# Patient Record
Sex: Female | Born: 1985 | Race: Black or African American | Hispanic: No | Marital: Married | Smoking: Never smoker
Health system: Southern US, Community
[De-identification: ages and names within clinical notes are randomized; demographics above are authoritative.]

## PROBLEM LIST (undated history)

## (undated) DIAGNOSIS — K219 Gastro-esophageal reflux disease without esophagitis: Secondary | ICD-10-CM

## (undated) DIAGNOSIS — Z789 Other specified health status: Secondary | ICD-10-CM

## (undated) HISTORY — PX: NO PAST SURGERIES: SHX2092

---

## 2016-10-28 LAB — OB RESULTS CONSOLE HIV ANTIBODY (ROUTINE TESTING): HIV: NONREACTIVE

## 2016-10-28 LAB — OB RESULTS CONSOLE ANTIBODY SCREEN: ANTIBODY SCREEN: NEGATIVE

## 2016-10-28 LAB — OB RESULTS CONSOLE HEPATITIS B SURFACE ANTIGEN: Hepatitis B Surface Ag: NEGATIVE

## 2016-10-28 LAB — OB RESULTS CONSOLE GC/CHLAMYDIA
Chlamydia: NEGATIVE
GC PROBE AMP, GENITAL: NEGATIVE

## 2016-10-28 LAB — OB RESULTS CONSOLE ABO/RH: RH TYPE: POSITIVE

## 2016-10-28 LAB — OB RESULTS CONSOLE RUBELLA ANTIBODY, IGM: RUBELLA: IMMUNE

## 2016-10-28 LAB — OB RESULTS CONSOLE RPR: RPR: NONREACTIVE

## 2017-01-14 NOTE — L&D Delivery Note (Signed)
Delivery Note Patient pushed for 3 hours 45 minutes.  She was offered a VAVD to assist delivery as she became fatigued, but she declined.  At 6:41 PM a viable female was delivered via Vaginal, Spontaneous (Presentation:LOA, with right hand compound presentation).  APGAR: 8, 9; weight pending .   Placenta status: spontaneous, in tact .  Cord: 3V with the following complications: None.  Cord pH: n/a  Anesthesia:  Epidural Episiotomy: None Lacerations: left vaginal sidewall , bilateral labial Suture Repair: 2.0 3.0 vicryl rapide Est. Blood Loss (mL): 300  Mom to postpartum.  Baby to Couplet care / Skin to Skin.  Arlee Santosuosso GEFFEL Toris Laverdiere 06/01/2017, 7:14 PM

## 2017-03-28 ENCOUNTER — Other Ambulatory Visit: Payer: Self-pay | Admitting: Obstetrics and Gynecology

## 2017-03-28 DIAGNOSIS — R1011 Right upper quadrant pain: Secondary | ICD-10-CM

## 2017-04-25 ENCOUNTER — Ambulatory Visit
Admission: RE | Admit: 2017-04-25 | Discharge: 2017-04-25 | Disposition: A | Discharge status: Home / Self Care | Payer: Self-pay | Source: Ambulatory Visit | Attending: Obstetrics and Gynecology | Admitting: Obstetrics and Gynecology

## 2017-04-25 DIAGNOSIS — R1011 Right upper quadrant pain: Secondary | ICD-10-CM

## 2017-05-09 LAB — OB RESULTS CONSOLE GBS: STREP GROUP B AG: NEGATIVE

## 2017-05-15 ENCOUNTER — Other Ambulatory Visit (HOSPITAL_COMMUNITY): Payer: Self-pay | Admitting: Obstetrics and Gynecology

## 2017-05-15 ENCOUNTER — Ambulatory Visit (HOSPITAL_COMMUNITY)
Admission: RE | Admit: 2017-05-15 | Discharge: 2017-05-15 | Disposition: A | Discharge status: Home / Self Care | Payer: BLUE CROSS/BLUE SHIELD | Source: Ambulatory Visit | Attending: Obstetrics and Gynecology | Admitting: Obstetrics and Gynecology

## 2017-05-15 DIAGNOSIS — O368130 Decreased fetal movements, third trimester, not applicable or unspecified: Secondary | ICD-10-CM

## 2017-05-15 DIAGNOSIS — Z3A36 36 weeks gestation of pregnancy: Secondary | ICD-10-CM

## 2017-05-31 ENCOUNTER — Inpatient Hospital Stay (HOSPITAL_COMMUNITY)
Admission: AD | Admit: 2017-05-31 | Discharge: 2017-06-01 | Disposition: A | Discharge status: Home / Self Care | Payer: BLUE CROSS/BLUE SHIELD | Source: Ambulatory Visit | Attending: Obstetrics | Admitting: Obstetrics

## 2017-05-31 ENCOUNTER — Encounter (HOSPITAL_COMMUNITY): Payer: Self-pay | Admitting: *Deleted

## 2017-05-31 DIAGNOSIS — O471 False labor at or after 37 completed weeks of gestation: Secondary | ICD-10-CM

## 2017-05-31 HISTORY — DX: Other specified health status: Z78.9

## 2017-05-31 NOTE — MAU Note (Signed)
CTX 2-3 mins apart.  No LOF/VB.  +FM.  Patient does not speak English-understands [place].  Reports no problems with the pregnancy

## 2017-06-01 ENCOUNTER — Encounter (HOSPITAL_COMMUNITY): Payer: Self-pay

## 2017-06-01 ENCOUNTER — Inpatient Hospital Stay (HOSPITAL_COMMUNITY)
Admission: AD | Admit: 2017-06-01 | Discharge: 2017-06-03 | DRG: 807 | Disposition: A | Discharge status: Home / Self Care | Payer: BLUE CROSS/BLUE SHIELD | Source: Ambulatory Visit | Attending: Obstetrics | Admitting: Obstetrics

## 2017-06-01 ENCOUNTER — Inpatient Hospital Stay (HOSPITAL_COMMUNITY): Payer: BLUE CROSS/BLUE SHIELD | Admitting: Anesthesiology

## 2017-06-01 ENCOUNTER — Other Ambulatory Visit: Payer: Self-pay

## 2017-06-01 DIAGNOSIS — O326XX0 Maternal care for compound presentation, not applicable or unspecified: Secondary | ICD-10-CM | POA: Diagnosis present

## 2017-06-01 DIAGNOSIS — Z3A38 38 weeks gestation of pregnancy: Secondary | ICD-10-CM

## 2017-06-01 DIAGNOSIS — Z3483 Encounter for supervision of other normal pregnancy, third trimester: Secondary | ICD-10-CM | POA: Diagnosis present

## 2017-06-01 LAB — TYPE AND SCREEN
ABO/RH(D): O POS
ANTIBODY SCREEN: NEGATIVE

## 2017-06-01 LAB — CBC
HCT: 38.3 % (ref 36.0–46.0)
Hemoglobin: 12.8 g/dL (ref 12.0–15.0)
MCH: 30.9 pg (ref 26.0–34.0)
MCHC: 33.4 g/dL (ref 30.0–36.0)
MCV: 92.5 fL (ref 78.0–100.0)
PLATELETS: 172 10*3/uL (ref 150–400)
RBC: 4.14 MIL/uL (ref 3.87–5.11)
RDW: 15.9 % — ABNORMAL HIGH (ref 11.5–15.5)
WBC: 14.8 10*3/uL — AB (ref 4.0–10.5)

## 2017-06-01 LAB — ABO/RH: ABO/RH(D): O POS

## 2017-06-01 MED ORDER — TETANUS-DIPHTH-ACELL PERTUSSIS 5-2.5-18.5 LF-MCG/0.5 IM SUSP: 0.5000 mL | Freq: Once | INTRAMUSCULAR | Status: DC

## 2017-06-01 MED ORDER — PHENYLEPHRINE 40 MCG/ML (10ML) SYRINGE FOR IV PUSH (FOR BLOOD PRESSURE SUPPORT)
80.0000 ug | PREFILLED_SYRINGE | INTRAVENOUS | Status: DC | PRN
  Filled 2017-06-01: qty 10
  Filled 2017-06-01: qty 5

## 2017-06-01 MED ORDER — EPHEDRINE 5 MG/ML INJ: 10.0000 mg | INTRAVENOUS | Status: DC | PRN

## 2017-06-01 MED ORDER — OXYTOCIN 40 UNITS IN LACTATED RINGERS INFUSION - SIMPLE MED
1.0000 m[IU]/min | INTRAVENOUS | Status: DC
  Administered 2017-06-01: 2 m[IU]/min via INTRAVENOUS

## 2017-06-01 MED ORDER — LACTATED RINGERS IV SOLN: 500.0000 mL | Freq: Once | INTRAVENOUS | Status: DC

## 2017-06-01 MED ORDER — PHENYLEPHRINE 40 MCG/ML (10ML) SYRINGE FOR IV PUSH (FOR BLOOD PRESSURE SUPPORT)
80.0000 ug | PREFILLED_SYRINGE | INTRAVENOUS | Status: DC | PRN
  Filled 2017-06-01: qty 5

## 2017-06-01 MED ORDER — LIDOCAINE HCL (PF) 1 % IJ SOLN
INTRAMUSCULAR | Status: DC | PRN
  Administered 2017-06-01 (×2): 5 mL via EPIDURAL

## 2017-06-01 MED ORDER — SENNOSIDES-DOCUSATE SODIUM 8.6-50 MG PO TABS
2.0000 | ORAL_TABLET | ORAL | Status: DC
  Administered 2017-06-02 – 2017-06-03 (×2): 2 via ORAL
  Filled 2017-06-01 (×2): qty 2

## 2017-06-01 MED ORDER — OXYCODONE-ACETAMINOPHEN 5-325 MG PO TABS: 2.0000 | ORAL_TABLET | ORAL | Status: DC | PRN

## 2017-06-01 MED ORDER — ONDANSETRON HCL 4 MG/2ML IJ SOLN: 4.0000 mg | Freq: Four times a day (QID) | INTRAMUSCULAR | Status: DC | PRN

## 2017-06-01 MED ORDER — FENTANYL CITRATE (PF) 100 MCG/2ML IJ SOLN
50.0000 ug | INTRAMUSCULAR | Status: DC | PRN
  Administered 2017-06-01: 100 ug via INTRAVENOUS
  Filled 2017-06-01: qty 2

## 2017-06-01 MED ORDER — BENZOCAINE-MENTHOL 20-0.5 % EX AERO
1.0000 "application " | INHALATION_SPRAY | CUTANEOUS | Status: DC | PRN
  Administered 2017-06-01: 1 via TOPICAL
  Filled 2017-06-01: qty 56

## 2017-06-01 MED ORDER — MORPHINE SULFATE (PF) 4 MG/ML IV SOLN
2.0000 mg | Freq: Once | INTRAVENOUS | Status: AC
  Administered 2017-06-01: 2 mg via INTRAVENOUS
  Filled 2017-06-01: qty 1

## 2017-06-01 MED ORDER — EPHEDRINE 5 MG/ML INJ
10.0000 mg | INTRAVENOUS | Status: DC | PRN
  Filled 2017-06-01: qty 2

## 2017-06-01 MED ORDER — PHENYLEPHRINE 40 MCG/ML (10ML) SYRINGE FOR IV PUSH (FOR BLOOD PRESSURE SUPPORT): 80.0000 ug | PREFILLED_SYRINGE | INTRAVENOUS | Status: DC | PRN

## 2017-06-01 MED ORDER — OXYCODONE HCL 5 MG PO TABS: 10.0000 mg | ORAL_TABLET | ORAL | Status: DC | PRN

## 2017-06-01 MED ORDER — PROMETHAZINE HCL 25 MG/ML IJ SOLN
12.5000 mg | Freq: Once | INTRAMUSCULAR | Status: AC
  Administered 2017-06-01: 12.5 mg via INTRAVENOUS
  Filled 2017-06-01: qty 1

## 2017-06-01 MED ORDER — DIPHENHYDRAMINE HCL 25 MG PO CAPS: 25.0000 mg | ORAL_CAPSULE | Freq: Four times a day (QID) | ORAL | Status: DC | PRN

## 2017-06-01 MED ORDER — ACETAMINOPHEN 325 MG PO TABS
650.0000 mg | ORAL_TABLET | ORAL | Status: DC | PRN
  Administered 2017-06-01: 650 mg via ORAL
  Filled 2017-06-01: qty 2

## 2017-06-01 MED ORDER — ONDANSETRON HCL 4 MG/2ML IJ SOLN: 4.0000 mg | INTRAMUSCULAR | Status: DC | PRN

## 2017-06-01 MED ORDER — IBUPROFEN 600 MG PO TABS
600.0000 mg | ORAL_TABLET | Freq: Four times a day (QID) | ORAL | Status: DC
  Administered 2017-06-02 – 2017-06-03 (×6): 600 mg via ORAL
  Filled 2017-06-01 (×7): qty 1

## 2017-06-01 MED ORDER — WITCH HAZEL-GLYCERIN EX PADS: 1.0000 "application " | MEDICATED_PAD | CUTANEOUS | Status: DC | PRN

## 2017-06-01 MED ORDER — TERBUTALINE SULFATE 1 MG/ML IJ SOLN
0.2500 mg | Freq: Once | INTRAMUSCULAR | Status: DC | PRN
  Filled 2017-06-01: qty 1

## 2017-06-01 MED ORDER — ACETAMINOPHEN 325 MG PO TABS: 650.0000 mg | ORAL_TABLET | ORAL | Status: DC | PRN

## 2017-06-01 MED ORDER — OXYCODONE HCL 5 MG PO TABS: 5.0000 mg | ORAL_TABLET | ORAL | Status: DC | PRN

## 2017-06-01 MED ORDER — SOD CITRATE-CITRIC ACID 500-334 MG/5ML PO SOLN: 30.0000 mL | ORAL | Status: DC | PRN

## 2017-06-01 MED ORDER — OXYTOCIN BOLUS FROM INFUSION
500.0000 mL | Freq: Once | INTRAVENOUS | Status: AC
  Administered 2017-06-01: 500 mL via INTRAVENOUS

## 2017-06-01 MED ORDER — PRENATAL MULTIVITAMIN CH
1.0000 | ORAL_TABLET | Freq: Every day | ORAL | Status: DC
  Administered 2017-06-02 – 2017-06-03 (×2): 1 via ORAL
  Filled 2017-06-01 (×2): qty 1

## 2017-06-01 MED ORDER — DIPHENHYDRAMINE HCL 50 MG/ML IJ SOLN: 12.5000 mg | INTRAMUSCULAR | Status: DC | PRN

## 2017-06-01 MED ORDER — LACTATED RINGERS IV SOLN: 500.0000 mL | INTRAVENOUS | Status: DC | PRN

## 2017-06-01 MED ORDER — OXYCODONE-ACETAMINOPHEN 5-325 MG PO TABS: 1.0000 | ORAL_TABLET | ORAL | Status: DC | PRN

## 2017-06-01 MED ORDER — SIMETHICONE 80 MG PO CHEW: 80.0000 mg | CHEWABLE_TABLET | ORAL | Status: DC | PRN

## 2017-06-01 MED ORDER — OXYTOCIN 40 UNITS IN LACTATED RINGERS INFUSION - SIMPLE MED
2.5000 [IU]/h | INTRAVENOUS | Status: DC
  Filled 2017-06-01: qty 1000

## 2017-06-01 MED ORDER — FENTANYL 2.5 MCG/ML BUPIVACAINE 1/10 % EPIDURAL INFUSION (WH - ANES)
14.0000 mL/h | INTRAMUSCULAR | Status: DC | PRN
  Administered 2017-06-01: 14 mL/h via EPIDURAL
  Filled 2017-06-01: qty 100

## 2017-06-01 MED ORDER — EPHEDRINE 5 MG/ML INJ
10.0000 mg | INTRAVENOUS | Status: DC | PRN
Start: 2017-06-01 — End: 2017-06-01

## 2017-06-01 MED ORDER — ONDANSETRON HCL 4 MG PO TABS: 4.0000 mg | ORAL_TABLET | ORAL | Status: DC | PRN

## 2017-06-01 MED ORDER — COCONUT OIL OIL: 1.0000 "application " | TOPICAL_OIL | Status: DC | PRN

## 2017-06-01 MED ORDER — DIBUCAINE 1 % RE OINT: 1.0000 "application " | TOPICAL_OINTMENT | RECTAL | Status: DC | PRN

## 2017-06-01 MED ORDER — LACTATED RINGERS IV BOLUS
500.0000 mL | Freq: Once | INTRAVENOUS | Status: AC
Start: 2017-06-01 — End: 2017-06-01
  Administered 2017-06-01: 500 mL via INTRAVENOUS

## 2017-06-01 MED ORDER — LIDOCAINE HCL (PF) 1 % IJ SOLN
30.0000 mL | INTRAMUSCULAR | Status: DC | PRN
  Filled 2017-06-01: qty 30

## 2017-06-01 MED ORDER — LACTATED RINGERS IV SOLN
INTRAVENOUS | Status: DC
  Administered 2017-06-01: 09:00:00 via INTRAVENOUS

## 2017-06-01 NOTE — Anesthesia Preprocedure Evaluation (Addendum)
Anesthesia Evaluation  Patient identified by MRN, date of birth, ID band Patient awake    Reviewed: Allergy & Precautions, NPO status , Patient's Chart, lab work & pertinent test results  History of Anesthesia Complications Negative for: history of anesthetic complications  Airway Mallampati: III  TM Distance: >3 FB Neck ROM: Full    Dental  (+) Dental Advisory Given   Pulmonary neg pulmonary ROS,    breath sounds clear to auscultation       Cardiovascular negative cardio ROS   Rhythm:Regular Rate:Normal     Neuro/Psych negative neurological ROS     GI/Hepatic Neg liver ROS, GERD  ,  Endo/Other  negative endocrine ROS  Renal/GU negative Renal ROS     Musculoskeletal   Abdominal   Peds  Hematology plt 172k   Anesthesia Other Findings   Reproductive/Obstetrics (+) Pregnancy                            Anesthesia Physical Anesthesia Plan  ASA: II  Anesthesia Plan: Epidural   Post-op Pain Management:    Induction:   PONV Risk Score and Plan: 2 and Treatment may vary due to age or medical condition  Airway Management Planned: Natural Airway  Additional Equipment:   Intra-op Plan:   Post-operative Plan:   Informed Consent: I have reviewed the patients History and Physical, chart, labs and discussed the procedure including the risks, benefits and alternatives for the proposed anesthesia with the patient or authorized representative who has indicated his/her understanding and acceptance.   Dental advisory given  Plan Discussed with:   Anesthesia Plan Comments: (Patient identified. Risks/Benefits/Options discussed with patient, through translator, including but not limited to bleeding, infection, nerve damage, paralysis, failed block, incomplete pain control, headache, blood pressure changes, nausea, vomiting, reactions to medication both or allergic, itching and postpartum back  pain. Confirmed with bedside nurse the patient's most recent platelet count. Confirmed with patient that they are not currently taking any anticoagulation, have any bleeding history or any family history of bleeding disorders. Patient expressed understanding and wished to proceed. All questions were answered. )       Anesthesia Quick Evaluation

## 2017-06-01 NOTE — MAU Note (Signed)
I have communicated with Dr. Chestine Spore and reviewed vital signs:  Vitals:   05/31/17 2249  BP: 119/74  Pulse: 80  Resp: 18  Temp: 98.4 F (36.9 C)    Vaginal exam:  Dilation: 1 Effacement (%): 80, 90 Cervical Position: Middle Station: -2 Presentation: Vertex Exam by:: Latricia Heft, RN,   Also reviewed contraction pattern and that non-stress test is reactive.  It has been documented that patient is contracting every 2-4 minutes with minimal cervical change over 2.5 hours not indicating active labor.  Patient denies any other complaints.  Based on this report provider has given order for discharge.  A discharge order and diagnosis entered by a provider.   Labor discharge instructions reviewed with patient.

## 2017-06-01 NOTE — H&P (Signed)
32 y.o. G1P0 @ [redacted]w[redacted]d presents with labor.  Otherwise has good fetal movement and no bleeding.  Prenatal care has been uncomplicated.  She is dated by a first trimester [place] in [place] prior to transfer of care at approximately 25 weeks  Past Medical History:  Diagnosis Date  . Medical history non-contributory     Past Surgical History:  Procedure Laterality Date  . NO PAST SURGERIES      OB History  Gravida Para Term Preterm AB Living  1            SAB TAB Ectopic Multiple Live Births               # Outcome Date GA Lbr Len/2nd Weight Sex Delivery Anes PTL Lv  1 Current             Social History   Socioeconomic History  . Marital status: Married    Spouse name: Not on file  . Number of children: Not on file  . Years of education: Not on file  . Highest education level: Not on file  Occupational History  . Not on file  Tobacco Use  . Smoking status: Never Smoker  . Smokeless tobacco: Never Used  Substance and Sexual Activity  . Alcohol use: Never    Frequency: Never  . Drug use: Never  . Sexual activity: Yes   Patient has no known allergies.    Prenatal Transfer Tool  Maternal Diabetes: No Genetic Screening: Declined Maternal Ultrasounds/Referrals: Normal Fetal Ultrasounds or other Referrals:  None Maternal Substance Abuse:  No Significant Maternal Medications:  Meds include: Zantac Significant Maternal Lab Results: Lab values include: Group B Strep negative  ABO, Rh: O/Positive/-- (10/15 0000) Antibody: Negative (10/15 0000) Rubella: Immune (10/15 0000) RPR: Nonreactive (10/15 0000)  HBsAg: Negative (10/15 0000)  HIV: Non-reactive (10/15 0000)  GBS: Negative (04/26 0000)      Vitals:   06/01/17 0800  BP: 133/83  Pulse: 85  Resp: 20  Temp: 98.6 F (37 C)  SpO2: 100%     General:  Pain with contractions Abdomen:  soft, gravid Ex:  no edema SVE:  8/80/-1 per RN FHTs:  120s, mod var, + accels, no decels Toco:  q3-4 min  05/23/17--growth [place] 6lb  2oz (28%)  A/P   32 y.o. G1P0 [redacted]w[redacted]d presents with labor Epidural upon request FSR/ vtx/ GBS neg  Tadeusz Stahl GEFFEL Joesph Marcy

## 2017-06-01 NOTE — Anesthesia Procedure Notes (Signed)
Epidural Patient location during procedure: OB Start time: 06/01/2017 9:22 AM End time: 06/01/2017 9:46 AM  Staffing Anesthesiologist: Jairo Ben, MD Performed: anesthesiologist   Preanesthetic Checklist Completed: patient identified, surgical consent, pre-op evaluation, timeout performed, IV checked, risks and benefits discussed and monitors and equipment checked  Epidural Patient position: sitting Prep: site prepped and draped and DuraPrep Patient monitoring: blood pressure, continuous pulse ox and heart rate Approach: midline Location: L3-L4 Injection technique: LOR air  Needle:  Needle type: Tuohy  Needle gauge: 17 G Needle length: 9 cm Needle insertion depth: 4.5 cm Catheter type: closed end flexible Catheter size: 19 Gauge Catheter at skin depth: 9.5 cm Test dose: negative (1% lidocaine)  Assessment Events: blood not aspirated, injection not painful, no injection resistance, negative IV test and no paresthesia  Additional Notes Pt identified in Labor room.  Monitors applied. Working IV access confirmed. Sterile prep, drape lumbar spine.  1% lido local L 3,4.  #17ga Touhy LOR air at 4.5 cm L 3,4, cath in easily to 9.5 cm skin. Test dose OK, cath dosed and infusion begun.  Patient asymptomatic, VSS, no heme aspirated, tolerated well.  Sandford Craze, MDReason for block:procedure for pain

## 2017-06-01 NOTE — MAU Note (Signed)
Pt presents with c/o ctxs and VB.  Reports was seen last night in MAU for labor eval, sent home, cervix FT.  Reports ctxs every 2 minutes and having small amt VB.Reports +FM.  Unsure if LOF.

## 2017-06-01 NOTE — Progress Notes (Signed)
Comfortable w epidural.  Following ROM --thick mec noted after rupture of BBOW, patient was 9 cm.  Pitocin was started 1 hour ago as contractions had spaced to 6-8 minutes.    BP 104/63   Pulse 72   Temp 97.9 F (36.6 C) (Oral)   Resp 18   Ht  (1.651 m)   Wt 68.5 kg (151 lb)   SpO2 100%   BMI 25.13 kg/m   Toco: q2-3 EFM: 120s, mod var, + accels, Cat 1 SVE: 9.5/100/-1, LOT  A&P: Protracted active phase.   Susupect OT position.  Will place on peanut ball to help rotate

## 2017-06-01 NOTE — Lactation Note (Signed)
This note was copied from a baby's chart. Lactation Consultation Note  Patient Name: Jamie [name] Today's Date: 06/01/2017 Reason for consult: Initial assessment;Primapara;1st time breastfeeding;Early term 37-38.6wks  Used [employer] for Denita Lung, interpreter # [phone]  3 hours old early term female who is being exclusively BF by his mother, she's a P1. RN called for [facility] assistance due to mom's flat nipples and difficult latch. Mom had baby STS when entering the room, offered assistance with latch but mom declined stating that she didn't want to disturb her baby because "he already ate at the breast".  Taught mom how to hand express but unable to get any colostrum at this point, only did two breast compressions before mom asked to stop, her nipples are too sensitive, she's probably experiencing transient soreness. Noticed that mom has flat nipples that may make latch challenging. Asked mom to call for latch assistance the next time baby is ready to feed.  Discussed some lactation tools to aid for flat nipples. Offered to set up a DEBP and try a NS; brought some to the room, mom most likely to need a # 20; but she declined both DEBP and NS when she found out that they're non part items that were billable (she did ask about the cost of such items). RN assured her that her private insurance BCBS will probably cover those but mom prefers to wait at this point. Encouraged her to hand express in the mean time. Offered breast shells and hand pump as free items that were going to be part of her medical/lactation bill and she graciously agreed to have both. Shells and hand pump instructions, cleaning and storage were discussed, as well as milk storage guidelines.  Encouraged mom to feed baby STS 8-12 times/24 hours or sooner if feeding cues are present. If baby is not cueing in a 3 hour period she'll place him STS to the breast to give him the opportunity to feed. Mom will pre-pump before  feedings and feed any EBM to her baby with finger feeding. She'll wear shells with a nursing bra in between feedings during the daytime. BF brochure, BF resources and feeding diary were reviewed, mom aware of LC services and will call PRN.  Maternal Data Formula Feeding for Exclusion: No Has patient been taught Hand Expression?: Yes Does the patient have breastfeeding experience prior to this delivery?: No  Feeding Feeding Type: Breast Fed Length of feed: 0 min  LATCH Score Latch: Too sleepy or reluctant, no latch achieved, no sucking elicited.  Audible Swallowing: None  Type of Nipple: Flat(nipples dimple in middle)  Comfort (Breast/Nipple): Soft / non-tender  Hold (Positioning): Assistance needed to correctly position infant at breast and maintain latch.  LATCH Score: 4  Interventions Interventions: Breast feeding basics reviewed;Hand express;Breast massage;Breast compression;Hand pump;Shells;Skin to skin;Pre-pump if needed;Reverse pressure  Lactation Tools Discussed/Used Tools: Shells;Pump Breast pump type: Manual WIC Program: No Pump Review: Setup, frequency, and cleaning;Milk Storage Initiated by:: MPeck Date initiated:: 06/01/17   Consult Status Consult Status: Follow-up Date: 06/02/17 Follow-up type: In-patient    Emillio Ngo Venetia Constable 06/01/2017, 10:23 PM

## 2017-06-02 LAB — CBC
HCT: 35 % — ABNORMAL LOW (ref 36.0–46.0)
HEMOGLOBIN: 11.6 g/dL — AB (ref 12.0–15.0)
MCH: 30.9 pg (ref 26.0–34.0)
MCHC: 33.1 g/dL (ref 30.0–36.0)
MCV: 93.1 fL (ref 78.0–100.0)
PLATELETS: 156 10*3/uL (ref 150–400)
RBC: 3.76 MIL/uL — AB (ref 3.87–5.11)
RDW: 16.2 % — ABNORMAL HIGH (ref 11.5–15.5)
WBC: 17.4 10*3/uL — AB (ref 4.0–10.5)

## 2017-06-02 LAB — RPR: RPR: NONREACTIVE

## 2017-06-02 NOTE — Progress Notes (Signed)
Patient is eating, ambulating, voiding.  Pain control is good.  Appropriate lochia, no complaints.  Vitals:   06/01/17 2221 06/02/17 0147 06/02/17 0643 06/02/17 0851  BP: (!) 108/55 110/71 103/71 113/66  Pulse: 74 71 66 82  Resp: Temp: 98.6 F (37 C) 98.7 F (37.1 C) 97.6 F (36.4 C) 98.4 F (36.9 C)  TempSrc:  Oral  Tympanic  SpO2: 98% 99% 97%   Weight:      Height:        Fundus firm Ext: no calf tenderness  Lab Results  Component Value Date   WBC 17.4 (H) 06/02/2017   HGB 11.6 (L) 06/02/2017   HCT 35.0 (L) 06/02/2017   MCV 93.1 06/02/2017   PLT 156 06/02/2017    --/--/O POS (05/19 4098)  A/P Post partum day 1  Routine care.  Expect d/c 5/21  [place], Luther Parody

## 2017-06-02 NOTE — Lactation Note (Signed)
This note was copied from a baby's chart. Lactation Consultation Note  Patient Name: [name] ZOXWR'[id number] Date: 06/02/2017 Reason for consult: Follow-up assessment;Difficult latch Baby is 19 hours old.  It has been four hours since last feeding so feeding attempt encouraged.  Baby positioned in football hold.  20 mm nipple shield applied.  Baby very sleepy and no suck elicited.  Attempted suck training with a gloved finger but still no suck.  Mom desires to give formula.  Mom tried for several minutes but baby would not suck on nipple.  Reassured mom and instructed to continue to watch for feeding cues.  Encouraged to call for assist prn.  Maternal Data    Feeding Feeding Type: Breast Fed  LATCH Score Latch: Too sleepy or reluctant, no latch achieved, no sucking elicited.  Audible Swallowing: None  Type of Nipple: Flat  Comfort (Breast/Nipple): Soft / non-tender  Hold (Positioning): Assistance needed to correctly position infant at breast and maintain latch.  LATCH Score: 4  Interventions    Lactation Tools Discussed/Used     Consult Status Consult Status: Follow-up Date: 06/03/17 Follow-up type: In-patient    Huston Foley 06/02/2017, 2:42 PM

## 2017-06-02 NOTE — Lactation Note (Signed)
This note was copied from a baby's chart. Lactation Consultation Note  Patient Name: Jamie [name] ZOXWR'[id number] Date: 06/02/2017   [facility] Follow Up Visit: Pt Request  Mother requested latch assiistance  Infant awake and mother attempting to latch infant on as I arrived.  I suggested proper positioning and switching from the cradle hold to the football position and mother willing to try.  Demonstrated putting on a #20 NS on mother's left breast.  Infant stimulated and mouth opened wide for a deep latch.  Mother with no complaint of pain.  Showed mother how to maintain a latch by holding firmly to breast.  Instructed mother in doing breast compressions while holding infant securely into breast.  Infant took intermittent sucks and continued for approximately 5 minutes before becoming sleepy at the breast.  No colostrum noted in NS but reassured mother to continue using the NS and to consider hand expression/pumping with DEBP after next feeding.  Mother is still not willing to use the DEBP but will consider this as an option after the next feeding.  I showed her hand expression but was not able to express any colostrum drops at this time.    Support person very cooperative and willing to help mother.  She is mother's support while here in the [place]  FOB not in the country as of now per RN.  RN updated and will continue to encourage hand expression/pumping.  Mother will call for assistance as needed.                    Youlanda Tomassetti R Azula Zappia 06/02/2017, 2:40 AM

## 2017-06-02 NOTE — Progress Notes (Signed)
Friend that is a Equities trader here at bedside. Pt requested [place] to use friend at this time.  Parent request formula to supplement breast feeding due to "Pt states doesn't feel like she had milk" . Parents have been informed of small tummy size of newborn, taught hand expression and understands the possible consequences of formula to the health of the infant. The possible consequences shared with patient include 1) Loss of confidence in breastfeeding 2) Engorgement 3) Allergic sensitization of baby(asthma/allergies) and 4) decreased milk supply for mother.After discussion of the above the mother decided to supplement using [employer].The tool used to give formula supplement will be nipple.

## 2017-06-02 NOTE — Lactation Note (Signed)
This note was copied from a baby's chart. Lactation Consultation Note  Patient Name: Jamie [name] [id number] Date: 06/02/2017 Reason for consult: Initial assessment;Primapara;1st time breastfeeding;Early term 37-38.6wks  LC Follow Up Visit: RN Request: Concern:  Mother has flat nipples and infant has not fed yet.  Mother and support person wanting to further discuss options for infant and feeding plan.  Support person states that mother has done a lot of STS, but, whenever she lays baby down he is fussy.    Mother is not from the [place] and knows how expensive healthcare and healthcare items cost in our country.   I acknowledged her concerns and discussed many different options.  The main concern is that mother has flat nipples and does not want to try a NS or a DEBP.because this will be an extra charge.  Infant is now 95 hours old.   He is not yet demanding feedings but will as he gets further along in hours of life. I politely reinforced the fact that we need to have a plan and baby needs to feed.  I reviewed the manual pump with her and encouraged her to try a NS at the next feeding.  I explained how she needs to stimulate the breasts either with the baby latching or the DEBP.  Support person understands and is willing to help mother make the best decision.  Infant is currently in the bassinet and quiet so I suggested we allow everyone time to rest for a couple hours and they can call me back when he shows feeding cues.  Reviewed feeding cues.     RN in room and knows plan.  When I return mother has agreed to try the NS.  She still does not want the DEBP but will see what success we have with the next feeding.  I will further review options after next feeding.                          Ashli Selders R Percival Glasheen 06/02/2017, 1:54 AM

## 2017-06-02 NOTE — Anesthesia Postprocedure Evaluation (Signed)
Anesthesia Post Note  Patient: Jamie [name]  Procedure(s) Performed: AN AD HOC LABOR EPIDURAL     Patient location during evaluation: Mother Baby Anesthesia Type: Epidural Level of consciousness: awake, awake and alert, oriented and patient cooperative Pain management: pain level controlled Vital Signs Assessment: post-procedure vital signs reviewed and stable Respiratory status: spontaneous breathing Cardiovascular status: blood pressure returned to baseline and stable Postop Assessment: no headache, no backache, patient able to bend at knees and able to ambulate Anesthetic complications: no    Last Vitals:  Vitals:   06/02/17 0147 06/02/17 0643  BP: 110/71 103/71  Pulse: 71 66  Resp: 16 16  Temp: 37.1 C 36.4 C  SpO2: 99% 97%    Last Pain:  Vitals:   06/02/17 0643  TempSrc:   PainSc: 0-No pain   Pain Goal:                 Jennelle Human

## 2017-06-03 MED ORDER — IBUPROFEN 600 MG PO TABS: 600.0000 mg | ORAL_TABLET | Freq: Four times a day (QID) | ORAL | 0 refills | Status: DC

## 2017-06-03 NOTE — Lactation Note (Signed)
This note was copied from a baby's chart. Lactation Consultation Note Mother has very inverted nipples. Mother has been attempting to latch infant. Mother reports that her nipples are sore. Infant latches to bare breast but mother complaints of pain. Instructions and teaching by [employer]. Advised mother to hand express and use milk to apply to nipples. Mother has few drips of colostrum when hand expresses.   Mother assist with placing #20 nipple shield on . Infant has poor shallow latch. Lots of teaching on proper positioning and holding infant with good support. Infant was given 8 ml of ebm with a #5 fr feeding tube. Mother has friend with her that is helping with all infant care. Mother reports that she is staying with a friend that has 4 children and she will have good support. Advised mother to do frequent skin to skin. Advised mother to cue base feed infant with the nipple shield. Discussed cluster feeding and advised mother to breastfeed infant 8-12 times in 24 hours. Mother to pump for 15 mins on each breast and offer ebm with wide base bottle nipple. Explained that mother may need to use formula for supplement until milk comes to volume. Mother has supplemental guidelines at the bedside. Mother advised to follow up with [facility] services. She is aware of all community support.   Patient Name: Jamie [name] [id number] Date: 06/03/2017 Reason for consult: Follow-up assessment   Maternal Data Has patient been taught Hand Expression?: Yes Does the patient have breastfeeding experience prior to this delivery?: No  Feeding Feeding Type: Breast Fed Length of feed: 0 min  LATCH Score Latch: Repeated attempts needed to sustain latch, nipple held in mouth throughout feeding, stimulation needed to elicit sucking reflex.  Audible Swallowing: A few with stimulation  Type of Nipple: Inverted  Comfort (Breast/Nipple): Filling, red/small blisters or bruises, mild/mod discomfort  Hold  (Positioning): Assistance needed to correctly position infant at breast and maintain latch.  LATCH Score: 4  Interventions    Lactation Tools Discussed/Used Tools: Nipple Shields;47F feeding tube / Syringe Nipple shield size: 20 WIC Program: No Pump Review: Setup, frequency, and cleaning;Milk Storage Initiated by:: Reviewed and encouraged every 2-3 hours for 15 minutes   Consult Status Consult Status: Follow-up Date: 06/03/17 Follow-up type: In-patient    Stevan [facility] 06/03/2017, 3:30 PM

## 2017-06-03 NOTE — Discharge Summary (Signed)
Obstetric Discharge Summary Reason for Admission: onset of labor Prenatal Procedures: ultrasound Intrapartum Procedures: spontaneous vaginal delivery Postpartum Procedures: none Complications-Operative and Postpartum: vaginal wall laceration Hemoglobin  Date Value Ref Range Status  06/02/2017 11.6 (L) 12.0 - 15.0 g/dL Final   HCT  Date Value Ref Range Status  06/02/2017 35.0 (L) 36.0 - 46.0 % Final    Physical Exam:  General: alert and cooperative Lochia: appropriate  Discharge Diagnoses: Term Pregnancy-delivered  Discharge Information: Date: 06/03/2017 Activity: pelvic rest Diet: routine Medications: PNV and Ibuprofen Condition: stable Instructions: refer to practice specific booklet Discharge to: home Follow-up Information    Marlow Baars, MD. Schedule an appointment as soon as possible for a visit in 1 month(s).   Specialty:  Obstetrics Contact information: [address] [place] [address] [phone]           Newborn Data: Live born female  Birth Weight: 7 lb 2.3 oz (3240 g) APGAR: 8, 9  Newborn Delivery   Birth date/time:  06/01/2017 18:41:00 Delivery type:  Vaginal, Spontaneous     Home with mother.  ANDERSON,MARK E 06/03/2017, 8:20 AM

## 2017-06-03 NOTE — Progress Notes (Signed)
PPD#2 Pt without complaints.  VSSAF IMP/ Stable Plan/Will discharge to home.

## 2017-06-03 NOTE — Lactation Note (Addendum)
This note was copied from a baby's chart. Lactation Consultation Note  Patient Name: Jamie [name] [id number] Date: 06/03/2017 Reason for consult: Follow-up assessment;Early term 37-38.6wks;1st time breastfeeding;Primapara;Difficult latch   Follow up with mom of 41 hour old infant. Spoke with mom with assistance of [employer] # [phone] Infant with 3 BF attempts, formula x 7 of 2-35 ml, 3 voids and 2 stools in the last 24 hours.   Mom reports she has no milk, reviewed milk coming to volume. Reviewed hand expression with mom and she was able to see a gtt of colostrum from the left breast that was finger fed to infant. Mom was excited to see colostrum. Mom is pumping with DEBP every 3 hours. She has a Lansinoh pump for home use. Mom is trying to see if she qualifies for [facility] to see if she can get a hospital grade pump. She is aware the Lansinoh pump is not as strong as the Symphony pump.   Mom is feeding infant at the breast with a NS. She reports he gets frustrated at the breast. Offered a 5 french feeding tube at the breast to see if that would help. Mom agreeable. Tools left in the room for mom to call out for next feeding.   Mom reports she has no other questions/concerns at this time. Mom to call out for next feeding. Report and POC to [place], Charity fundraiser.    Maternal Data Has patient been taught Hand Expression?: Yes Does the patient have breastfeeding experience prior to this delivery?: No  Feeding Feeding Type: Breast Fed Nipple Type: Slow - flow Length of feed: 0 min  LATCH Score Latch: Too sleepy or reluctant, no latch achieved, no sucking elicited.  Audible Swallowing: None  Type of Nipple: Inverted  Comfort (Breast/Nipple): Filling, red/small blisters or bruises, mild/mod discomfort  Hold (Positioning): No assistance needed to correctly position infant at breast.  LATCH Score: 3  Interventions Interventions: DEBP;Hand pump;Shells  Lactation Tools  Discussed/Used Tools: Nipple [employer] [facility] Program: No Pump Review: Setup, frequency, and cleaning;Milk Storage Initiated by:: Reviewed and encouraged every 2-3 hours for 15 minutes   Consult Status Consult Status: Follow-up Date: 06/04/17 Follow-up type: In-patient    [name] Gildardo Tickner 06/03/2017, 1:04 PM

## 2017-06-03 NOTE — Progress Notes (Signed)
[employer] interpreter [phone] used to translate Maternal and infant care, safety, meds, reasons to call the MD when discharged, breastfeeding, and pumping breasts.  Pt states understanding information.

## 2017-11-18 ENCOUNTER — Other Ambulatory Visit: Payer: Self-pay

## 2017-11-18 ENCOUNTER — Encounter (HOSPITAL_COMMUNITY): Payer: Self-pay | Admitting: Emergency Medicine

## 2017-11-18 ENCOUNTER — Ambulatory Visit (HOSPITAL_COMMUNITY)
Admission: EM | Admit: 2017-11-18 | Discharge: 2017-11-18 | Disposition: A | Discharge status: Home / Self Care | Payer: BLUE CROSS/BLUE SHIELD | Attending: Family Medicine | Admitting: Family Medicine

## 2017-11-18 DIAGNOSIS — M25532 Pain in left wrist: Secondary | ICD-10-CM | POA: Diagnosis not present

## 2017-11-18 DIAGNOSIS — M25531 Pain in right wrist: Secondary | ICD-10-CM

## 2017-11-18 MED ORDER — IBUPROFEN 600 MG PO TABS: 600.0000 mg | ORAL_TABLET | Freq: Three times a day (TID) | ORAL | 0 refills | Status: DC

## 2017-11-18 NOTE — ED Triage Notes (Addendum)
Bilateral wrist pain.  Pain has been present since may ( delivered baby).  Pain particularly bad with movement of thumbs on either hand

## 2017-11-18 NOTE — Discharge Instructions (Signed)
Start ibuprofen as directed. Ice compress, rest, wrist splint during activity. This may take a few weeks to completely resolve, but should be feeling better each week. Follow up with PCP for further evaluation if symptoms not improving.

## 2017-11-18 NOTE — ED Provider Notes (Signed)
[facility]    CSN: [id number] Arrival date & time: 11/18/17  0944     History   Chief Complaint Chief Complaint  Patient presents with  . Wrist Pain    HPI Nery Reser is a 32 y.o. female.   32 year old female comes in for bilateral wrist pain for the past 5-6 months. HPI obtained by patient through video translator. States pain has been present since delivering baby back in May. Pain to the radial wrist that is worse with movement. Denies radiation of pain, numbness/tingling. Denies injury/trauma. Has not taken anything for the symptoms.      Past Medical History:  Diagnosis Date  . Medical history non-contributory     Patient Active Problem List   Diagnosis Date Noted  . Normal labor 06/01/2017    Past Surgical History:  Procedure Laterality Date  . NO PAST SURGERIES      OB History    Gravida  1   Para  1   Term  1   Preterm      AB      Living  1     SAB      TAB      Ectopic      Multiple  0   Live Births  1            Home Medications    Prior to Admission medications   Medication Sig Start Date End Date Taking? Authorizing Provider  ibuprofen (ADVIL,MOTRIN) 600 MG tablet Take 1 tablet (600 mg total) by mouth 3 (three) times daily. 11/18/17   Belinda Fisher, PA-C  Prenatal Vit-Fe Fumarate-FA (PRENATAL MULTIVITAMIN) TABS tablet Take 1 tablet by mouth daily at 12 noon.    [provider]    Family History History reviewed. No pertinent family history.  Social History Social History   Tobacco Use  . Smoking status: Never Smoker  . Smokeless tobacco: Never Used  Substance Use Topics  . Alcohol use: Never    Frequency: Never  . Drug use: Never     Allergies   Patient has no known allergies.   Review of Systems Review of Systems  Reason unable to perform ROS: See HPI as above.     Physical Exam Triage Vital Signs ED Triage Vitals  Enc Vitals Group     BP 11/18/17 1005 110/67     Pulse Rate 11/18/17  1005 65     Resp 11/18/17 1005 18     Temp 11/18/17 1005 98.4 F (36.9 C)     Temp Source 11/18/17 1005 Oral     SpO2 11/18/17 1005 98 %     Weight --      Height --      Head Circumference --      Peak Flow --      Pain Score 11/18/17 1001 7     Pain Loc --      Pain Edu? --      Excl. in GC? --    No data found.  Updated Vital Signs BP 110/67 (BP Location: Right Arm)   Pulse 65   Temp 98.4 F (36.9 C) (Oral)   Resp 18   LMP 07/18/2016   SpO2 98%   Physical Exam  Constitutional: She is oriented to person, place, and time. She appears well-developed and well-nourished. No distress.  HENT:  Head: Normocephalic and atraumatic.  Eyes: Pupils are equal, round, and reactive to light. Conjunctivae are normal.  Musculoskeletal:  No obvious swelling, contusion, erythema, warmth. Tenderness to palpation of bilateral radial wrist. No tenderness to palpation along bilateral 1st MCP/hand. Full ROM of wrist and fingers. Strength normal and equal bilaterally. Sensation intact and equal bilaterally. Radial pulse 2+, cap refill <2s.   Neurological: She is alert and oriented to person, place, and time.  Skin: She is not diaphoretic.     UC Treatments / Results  Labs (all labs ordered are listed, but only abnormal results are displayed) Labs Reviewed - No data to display  EKG None  Radiology No results found.  Procedures Procedures (including critical care time)  Medications Ordered in UC Medications - No data to display  Initial Impression / Assessment and Plan / UC Course  I have reviewed the triage vital signs and the nursing notes.  Pertinent labs & imaging results that were available during my care of the patient were reviewed by me and considered in my medical decision making (see chart for details).    Will treat for tendinitis.  Given patient is breast-feeding, will have patient take ibuprofen.  Ice compress, wrist splint during activity.  Return precautions given.   Patient expresses understanding and agrees to plan.  Final Clinical Impressions(s) / UC Diagnoses   Final diagnoses:  Pain in both wrists    ED Prescriptions    Medication Sig Dispense Auth. Provider   ibuprofen (ADVIL,MOTRIN) 600 MG tablet Take 1 tablet (600 mg total) by mouth 3 (three) times daily. 30 tablet Threasa Alpha, [place] 11/18/17 1116

## 2017-11-18 NOTE — ED Notes (Signed)
Delay in discharge secondary to don joy I-pad in use

## 2017-11-18 NOTE — ED Notes (Signed)
Not in treatment room 

## 2018-11-09 ENCOUNTER — Other Ambulatory Visit: Payer: Self-pay

## 2018-11-09 ENCOUNTER — Ambulatory Visit (HOSPITAL_COMMUNITY)
Admission: EM | Admit: 2018-11-09 | Discharge: 2018-11-09 | Disposition: A | Discharge status: Home / Self Care | Payer: BC Managed Care – PPO | Attending: Family Medicine | Admitting: Family Medicine

## 2018-11-09 ENCOUNTER — Encounter (HOSPITAL_COMMUNITY): Payer: Self-pay

## 2018-11-09 DIAGNOSIS — K219 Gastro-esophageal reflux disease without esophagitis: Secondary | ICD-10-CM

## 2018-11-09 HISTORY — DX: Gastro-esophageal reflux disease without esophagitis: K21.9

## 2018-11-09 MED ORDER — OMEPRAZOLE 20 MG PO CPDR: 20.0000 mg | DELAYED_RELEASE_CAPSULE | Freq: Every day | ORAL | 0 refills | Status: DC

## 2018-11-09 NOTE — ED Provider Notes (Signed)
[facility]    CSN: [id number] Arrival date & time: 11/09/18  1101      History   Chief Complaint Chief Complaint  Patient presents with  . Gastroesophageal Reflux    HPI Jamie [name] is a 33 y.o. female.   Jamie [name] presents with complaints of gastric reflux. This has been an issue for her for years now. Has had upper endoscopy related to this in the past, when she was living in [place]. She states that her results were unremarkable. She feels that she was evaluated for h pylori and was negative. She has taken omeprazole in the past which helps, but she no longer has. Moved to the [place] in 2019. Sometimes has vomiting. No blood or black in emesis. Pain is triggered with eating at times as well as sometimes at night. No current symptoms. No shortness of breath . No current nausea.    ROS per HPI, negative if not otherwise mentioned.      Past Medical History:  Diagnosis Date  . GERD (gastroesophageal reflux disease)   . Medical history non-contributory     Patient Active Problem List   Diagnosis Date Noted  . Normal labor 06/01/2017    Past Surgical History:  Procedure Laterality Date  . NO PAST SURGERIES      OB History    Gravida  1   Para  1   Term  1   Preterm      AB      Living  1     SAB      TAB      Ectopic      Multiple  0   Live Births  1            Home Medications    Prior to Admission medications   Medication Sig Start Date End Date Taking? Authorizing Provider  ibuprofen (ADVIL,MOTRIN) 600 MG tablet Take 1 tablet (600 mg total) by mouth 3 (three) times daily. 11/18/17   Tasia Catchings, Amy V, PA-C  omeprazole (PRILOSEC) 20 MG capsule Take 1 capsule (20 mg total) by mouth daily. 11/09/18   Zigmund Gottron, NP  Prenatal Vit-Fe Fumarate-FA (PRENATAL MULTIVITAMIN) TABS tablet Take 1 tablet by mouth daily at 12 noon.    [provider]    Family History History reviewed. No pertinent family history.  Social History  Social History   Tobacco Use  . Smoking status: Never Smoker  . Smokeless tobacco: Never Used  Substance Use Topics  . Alcohol use: Never    Frequency: Never  . Drug use: Never     Allergies   Patient has no known allergies.   Review of Systems Review of Systems   Physical Exam Triage Vital Signs ED Triage Vitals  Enc Vitals Group     BP 11/09/18 1206 108/72     Pulse Rate 11/09/18 1206 82     Resp 11/09/18 1206 16     Temp 11/09/18 1206 98.3 F (36.8 C)     Temp Source 11/09/18 1206 Oral     SpO2 11/09/18 1206 100 %     Weight 11/09/18 1203 140 lb (63.5 kg)     Height --      Head Circumference --      Peak Flow --      Pain Score 11/09/18 1203 5     Pain Loc --      Pain Edu? --      Excl. in [place]? --  No data found.  Updated Vital Signs BP 108/72 (BP Location: Right Arm)   Pulse 82   Temp 98.3 F (36.8 C) (Oral)   Resp 16   Wt 140 lb (63.5 kg)   LMP 10/29/2018   SpO2 100%   BMI 23.30 kg/m    Physical Exam Constitutional:      General: She is not in acute distress.    Appearance: She is well-developed.  Cardiovascular:     Rate and Rhythm: Normal rate.  Pulmonary:     Effort: Pulmonary effort is normal.  Abdominal:     Tenderness: There is no abdominal tenderness.  Skin:    General: Skin is warm and dry.  Neurological:     Mental Status: She is alert and oriented to person, place, and time.      UC Treatments / Results  Labs (all labs ordered are listed, but only abnormal results are displayed) Labs Reviewed - No data to display  EKG   Radiology No results found.  Procedures Procedures (including critical care time)  Medications Ordered in UC Medications - No data to display  Initial Impression / Assessment and Plan / UC Course  I have reviewed the triage vital signs and the nursing notes.  Pertinent labs & imaging results that were available during my care of the patient were reviewed by me and considered in my medical  decision making (see chart for details).     Non toxic. Benign physical exam. Omeprazole refilled. Encouraged establish with a pcp for long term management. Patient verbalized understanding and agreeable to plan.   Final Clinical Impressions(s) / UC Diagnoses   Final diagnoses:  Gastroesophageal reflux disease, unspecified whether esophagitis present     Discharge Instructions     Please take omeprazole daily.  See provided recommendations for diet.  Please call to establish with a primary care provider.     ED Prescriptions    Medication Sig Dispense Auth. Provider   omeprazole (PRILOSEC) 20 MG capsule Take 1 capsule (20 mg total) by mouth daily. 30 capsule Georgetta Haber, NP     PDMP not reviewed this encounter.   Georgetta Haber, NP 11/09/18 1309

## 2018-11-09 NOTE — ED Triage Notes (Signed)
Pt states she has acid reflux and ringing in her ears. X 2 days.

## 2018-11-09 NOTE — Discharge Instructions (Signed)
Please take omeprazole daily.  See provided recommendations for diet.  Please call to establish with a primary care provider.

## 2019-04-27 ENCOUNTER — Encounter (HOSPITAL_COMMUNITY): Payer: Self-pay

## 2019-04-27 ENCOUNTER — Ambulatory Visit (HOSPITAL_COMMUNITY)
Admission: EM | Admit: 2019-04-27 | Discharge: 2019-04-27 | Disposition: A | Discharge status: Home / Self Care | Payer: BC Managed Care – PPO | Attending: Family Medicine | Admitting: Family Medicine

## 2019-04-27 ENCOUNTER — Other Ambulatory Visit: Payer: Self-pay

## 2019-04-27 DIAGNOSIS — Z3201 Encounter for pregnancy test, result positive: Secondary | ICD-10-CM | POA: Diagnosis not present

## 2019-04-27 DIAGNOSIS — T7840XA Allergy, unspecified, initial encounter: Secondary | ICD-10-CM | POA: Diagnosis not present

## 2019-04-27 LAB — POCT URINALYSIS DIP (DEVICE)
Bilirubin Urine: NEGATIVE
Glucose, UA: NEGATIVE mg/dL
Hgb urine dipstick: NEGATIVE
Ketones, ur: NEGATIVE mg/dL
Leukocytes,Ua: NEGATIVE
Nitrite: NEGATIVE
Protein, ur: NEGATIVE mg/dL
Specific Gravity, Urine: 1.015 (ref 1.005–1.030)
Urobilinogen, UA: 0.2 mg/dL (ref 0.0–1.0)
pH: 8 (ref 5.0–8.0)

## 2019-04-27 LAB — POCT PREGNANCY, URINE: Preg Test, Ur: POSITIVE — AB

## 2019-04-27 LAB — POC URINE PREG, ED: Preg Test, Ur: POSITIVE — AB

## 2019-04-27 MED ORDER — CETIRIZINE HCL 10 MG PO TABS: 10.0000 mg | ORAL_TABLET | Freq: Every day | ORAL | 0 refills | Status: DC

## 2019-04-27 NOTE — Discharge Instructions (Addendum)
I believe that your symptoms are allergy related Flonase nasal spray for symptoms. You can also take claritin as you have been  Your pregnancy test was positive.  You need to follow up with OB/GYN

## 2019-04-27 NOTE — ED Triage Notes (Signed)
Pt state she has multiple complaints pt states she has dizziness and body aches x 1 week. Pt states her has not been able to eat, she has abdominal pain x 1 week. Pt states she has muscle aches as well.x 1 week or more.

## 2019-04-28 NOTE — ED Provider Notes (Signed)
[facility]    CSN: [id number] Arrival date & time: 04/27/19  9211      History   Chief Complaint Chief Complaint  Patient presents with  . Dizziness    HPI Jamie [name] is a 34 y.o. female.   Pt is a 34 year old female that presents with dizziness, nasal congestion, sneezing, itchy watery eyes, generalized body aches x 1 week. She has also had some generalized abd pain. Patient's last menstrual period was 03/14/2019. No dysuria, hematuria, urinary frequency, vaginal discharge, vaginal bleeding.  Symptoms have been constant, waxing waning over the past week.  She has been using Claritin for allergy type symptoms.  Denies any recent sick contacts or recent traveling.  No fever, chills, nausea, vomiting, diarrhea.  ROS per HPI      Past Medical History:  Diagnosis Date  . GERD (gastroesophageal reflux disease)   . Medical history non-contributory     Patient Active Problem List   Diagnosis Date Noted  . Normal labor 06/01/2017    Past Surgical History:  Procedure Laterality Date  . NO PAST SURGERIES      OB History    Gravida  1   Para  1   Term  1   Preterm      AB      Living  1     SAB      TAB      Ectopic      Multiple  0   Live Births  1            Home Medications    Prior to Admission medications   Medication Sig Start Date End Date Taking? Authorizing Provider  ibuprofen (ADVIL,MOTRIN) 600 MG tablet Take 1 tablet (600 mg total) by mouth 3 (three) times daily. 11/18/17   Cathie Hoops, Amy V, PA-C  omeprazole (PRILOSEC) 20 MG capsule Take 1 capsule (20 mg total) by mouth daily. 11/09/18   Georgetta Haber, NP  Prenatal Vit-Fe Fumarate-FA (PRENATAL MULTIVITAMIN) TABS tablet Take 1 tablet by mouth daily at 12 noon.    [provider]  cetirizine (ZYRTEC) 10 MG tablet Take 1 tablet (10 mg total) by mouth daily. 04/27/19 04/27/19  Janace Aris, NP    Family History History reviewed. No pertinent family history.  Social  History Social History   Tobacco Use  . Smoking status: Never Smoker  . Smokeless tobacco: Never Used  Substance Use Topics  . Alcohol use: Never  . Drug use: Never     Allergies   Patient has no known allergies.   Review of Systems Review of Systems   Physical Exam Triage Vital Signs ED Triage Vitals  Enc Vitals Group     BP 04/27/19 0841 108/62     Pulse Rate 04/27/19 0841 72     Resp 04/27/19 0841 16     Temp 04/27/19 0841 98.7 F (37.1 C)     Temp Source 04/27/19 0841 Oral     SpO2 04/27/19 0841 100 %     Weight 04/27/19 0847 100 lb (45.4 kg)     Height --      Head Circumference --      Peak Flow --      Pain Score 04/27/19 0846 5     Pain Loc --      Pain Edu? --      Excl. in GC? --    Orthostatic VS for the past 24 hrs:  BP- Lying Pulse-  Lying BP- Sitting Pulse- Sitting BP- Standing at 0 minutes Pulse- Standing at 0 minutes  04/27/19 0852 106/65 68 105/65 66 106/66 66    Updated Vital Signs BP 108/62 (BP Location: Left Arm)   Pulse 72   Temp 98.7 F (37.1 C) (Oral)   Resp 16   Wt 100 lb (45.4 kg)   LMP 03/14/2019   SpO2 100%   BMI 16.64 kg/m   Visual Acuity Right Eye Distance:   Left Eye Distance:   Bilateral Distance:    Right Eye Near:   Left Eye Near:    Bilateral Near:     Physical Exam Vitals and nursing note reviewed.  Constitutional:      General: She is not in acute distress.    Appearance: Normal appearance. She is not ill-appearing, toxic-appearing or diaphoretic.  HENT:     Head: Normocephalic.     Right Ear: Tympanic membrane and ear canal normal.     Left Ear: Tympanic membrane and ear canal normal.     Nose: Nose normal.     Mouth/Throat:     Pharynx: Oropharynx is clear.  Eyes:     Conjunctiva/sclera: Conjunctivae normal.  Cardiovascular:     Rate and Rhythm: Normal rate and regular rhythm.  Pulmonary:     Effort: Pulmonary effort is normal.     Breath sounds: Normal breath sounds.  Abdominal:     Palpations:  Abdomen is soft.     Tenderness: There is no abdominal tenderness.  Musculoskeletal:        General: Normal range of motion.     Cervical back: Normal range of motion.  Skin:    General: Skin is warm and dry.     Findings: No rash.  Neurological:     Mental Status: She is alert.  Psychiatric:        Mood and Affect: Mood normal.      UC Treatments / Results  Labs (all labs ordered are listed, but only abnormal results are displayed) Labs Reviewed  POC URINE PREG, ED - Abnormal; Notable for the following components:      Result Value   Preg Test, Ur POSITIVE (*)    All other components within normal limits  POCT PREGNANCY, URINE - Abnormal; Notable for the following components:   Preg Test, Ur POSITIVE (*)    All other components within normal limits  SARS CORONAVIRUS 2 (TAT 6-24 HRS)  POCT URINALYSIS DIP (DEVICE)    EKG   Radiology No results found.  Procedures Procedures (including critical care time)  Medications Ordered in UC Medications - No data to display  Initial Impression / Assessment and Plan / UC Course  I have reviewed the triage vital signs and the nursing notes.  Pertinent labs & imaging results that were available during my care of the patient were reviewed by me and considered in my medical decision making (see chart for details).     Positive pregnancy test-could be causing some of her symptoms.  Otherwise exam benign and she is not having any red flag symptoms. Recommend follow-up with OB/GYN. Prenatal vitamins and stay hydrated  Allergies-continue the Claritin and add Flonase Follow up as needed for continued or worsening symptoms  Final Clinical Impressions(s) / UC Diagnoses   Final diagnoses:  Allergy, initial encounter  Positive pregnancy test     Discharge Instructions     I believe that your symptoms are allergy related Flonase nasal spray for symptoms. You can also take  claritin as you have been  Your pregnancy test was  positive.  You need to follow up with OB/GYN    ED Prescriptions    Medication Sig Dispense Auth. Provider   cetirizine (ZYRTEC) 10 MG tablet  (Status: Discontinued) Take 1 tablet (10 mg total) by mouth daily. 30 tablet Loura Halt A, NP     PDMP not reviewed this encounter.   Orvan July, NP 04/28/19 [phone]

## 2019-06-04 ENCOUNTER — Encounter (HOSPITAL_BASED_OUTPATIENT_CLINIC_OR_DEPARTMENT_OTHER): Payer: Self-pay | Admitting: Obstetrics

## 2019-06-04 ENCOUNTER — Other Ambulatory Visit: Payer: Self-pay

## 2019-06-04 NOTE — Progress Notes (Addendum)
ADDENDUM: email received from interpeting no [employer] interpreter available use I pad or [employer] interpre[employer], email placed on patient chart.  Spoke w/ via phone for pre-op interview---patient with [facility] interpreter [employer] interpre[employer] number [phone] Lab needs dos----   Type and screen , cbc           COVID test ------ Arrive at -------530 am 06-08-2019 NPO after ------midnight Medications to take morning of surgery -----none Diabetic medication -----n/a Patient Special Instructions -----none Pre-Op special Istructions -----none Patient verbalized understanding of instructions that were given at this phone interview. Patient denies shortness of breath, chest pain, fever, cough a this phone interview.  [employer] interpreter requested via email for day of surgery, copy of email placed on patient chart

## 2019-06-05 ENCOUNTER — Other Ambulatory Visit (HOSPITAL_COMMUNITY)
Admission: RE | Admit: 2019-06-05 | Discharge: 2019-06-05 | Disposition: A | Discharge status: Home / Self Care | Payer: BC Managed Care – PPO | Source: Ambulatory Visit | Attending: Obstetrics | Admitting: Obstetrics

## 2019-06-05 DIAGNOSIS — Z01812 Encounter for preprocedural laboratory examination: Secondary | ICD-10-CM | POA: Diagnosis present

## 2019-06-05 DIAGNOSIS — Z20822 Contact with and (suspected) exposure to covid-19: Secondary | ICD-10-CM | POA: Diagnosis not present

## 2019-06-05 LAB — SARS CORONAVIRUS 2 (TAT 6-24 HRS): SARS Coronavirus 2: NEGATIVE

## 2019-06-07 ENCOUNTER — Encounter (HOSPITAL_BASED_OUTPATIENT_CLINIC_OR_DEPARTMENT_OTHER): Payer: Self-pay | Admitting: Obstetrics

## 2019-06-07 NOTE — Anesthesia Preprocedure Evaluation (Addendum)
Anesthesia Evaluation  Patient identified by MRN, date of birth, ID band Patient awake    Reviewed: Allergy & Precautions, NPO status , Patient's Chart, lab work & pertinent test results  Airway Mallampati: II  TM Distance: >3 FB Neck ROM: Full    Dental no notable dental hx. (+) Teeth Intact   Pulmonary neg pulmonary ROS,    Pulmonary exam normal breath sounds clear to auscultation       Cardiovascular negative cardio ROS Normal cardiovascular exam Rhythm:Regular Rate:Normal     Neuro/Psych negative neurological ROS  negative psych ROS   GI/Hepatic Neg liver ROS, GERD  ,  Endo/Other  negative endocrine ROS  Renal/GU negative Renal ROS  negative genitourinary   Musculoskeletal   Abdominal   Peds  Hematology negative hematology ROS (+)   Anesthesia Other Findings   Reproductive/Obstetrics (+) Pregnancy Missed Ab- 9 weeks                            Anesthesia Physical Anesthesia Plan  ASA: II  Anesthesia Plan: MAC   Post-op Pain Management:    Induction: Intravenous  PONV Risk Score and Plan: 3 and Ondansetron and Treatment may vary due to age or medical condition  Airway Management Planned: LMA  Additional Equipment:   Intra-op Plan:   Post-operative Plan: Extubation in OR  Informed Consent: I have reviewed the patients History and Physical, chart, labs and discussed the procedure including the risks, benefits and alternatives for the proposed anesthesia with the patient or authorized representative who has indicated his/her understanding and acceptance.     Dental advisory given  Plan Discussed with: CRNA and Surgeon  Anesthesia Plan Comments:        Anesthesia Quick Evaluation

## 2019-06-08 ENCOUNTER — Encounter (HOSPITAL_BASED_OUTPATIENT_CLINIC_OR_DEPARTMENT_OTHER): Payer: Self-pay | Admitting: Obstetrics

## 2019-06-08 ENCOUNTER — Ambulatory Visit (HOSPITAL_BASED_OUTPATIENT_CLINIC_OR_DEPARTMENT_OTHER): Payer: BC Managed Care – PPO | Admitting: Anesthesiology

## 2019-06-08 ENCOUNTER — Encounter (HOSPITAL_BASED_OUTPATIENT_CLINIC_OR_DEPARTMENT_OTHER)
Admission: RE | Disposition: A | Discharge status: Home / Self Care | Payer: Self-pay | Source: Home / Self Care | Attending: Obstetrics

## 2019-06-08 ENCOUNTER — Ambulatory Visit (HOSPITAL_BASED_OUTPATIENT_CLINIC_OR_DEPARTMENT_OTHER)
Admission: RE | Admit: 2019-06-08 | Discharge: 2019-06-08 | Disposition: A | Discharge status: Home / Self Care | Payer: BC Managed Care – PPO | Attending: Obstetrics | Admitting: Obstetrics

## 2019-06-08 DIAGNOSIS — O99611 Diseases of the digestive system complicating pregnancy, first trimester: Secondary | ICD-10-CM | POA: Insufficient documentation

## 2019-06-08 DIAGNOSIS — O021 Missed abortion: Secondary | ICD-10-CM

## 2019-06-08 DIAGNOSIS — Z3A11 11 weeks gestation of pregnancy: Secondary | ICD-10-CM | POA: Diagnosis not present

## 2019-06-08 DIAGNOSIS — K219 Gastro-esophageal reflux disease without esophagitis: Secondary | ICD-10-CM | POA: Diagnosis not present

## 2019-06-08 DIAGNOSIS — O089 Unspecified complication following an ectopic and molar pregnancy: Secondary | ICD-10-CM | POA: Insufficient documentation

## 2019-06-08 HISTORY — PX: DILATION AND EVACUATION: SHX1459

## 2019-06-08 LAB — CBC
HCT: 41.2 % (ref 36.0–46.0)
Hemoglobin: 13.7 g/dL (ref 12.0–15.0)
MCH: 30.8 pg (ref 26.0–34.0)
MCHC: 33.3 g/dL (ref 30.0–36.0)
MCV: 92.6 fL (ref 80.0–100.0)
Platelets: 209 10*3/uL (ref 150–400)
RBC: 4.45 MIL/uL (ref 3.87–5.11)
RDW: 13.2 % (ref 11.5–15.5)
WBC: 6.1 10*3/uL (ref 4.0–10.5)
nRBC: 0 % (ref 0.0–0.2)

## 2019-06-08 LAB — TYPE AND SCREEN
ABO/RH(D): O POS
Antibody Screen: NEGATIVE

## 2019-06-08 LAB — ABO/RH: ABO/RH(D): O POS

## 2019-06-08 SURGERY — DILATION AND EVACUATION, UTERUS
Anesthesia: General | Site: Vagina

## 2019-06-08 MED ORDER — MIDAZOLAM HCL 2 MG/2ML IJ SOLN
INTRAMUSCULAR | Status: AC
  Filled 2019-06-08: qty 2

## 2019-06-08 MED ORDER — MIDAZOLAM HCL 2 MG/2ML IJ SOLN
INTRAMUSCULAR | Status: DC | PRN
  Administered 2019-06-08: 2 mg via INTRAVENOUS

## 2019-06-08 MED ORDER — PROPOFOL 10 MG/ML IV BOLUS
INTRAVENOUS | Status: DC | PRN
  Administered 2019-06-08: 150 mg via INTRAVENOUS

## 2019-06-08 MED ORDER — OXYCODONE HCL 5 MG PO TABS: 5.0000 mg | ORAL_TABLET | Freq: Once | ORAL | Status: DC | PRN

## 2019-06-08 MED ORDER — FENTANYL CITRATE (PF) 100 MCG/2ML IJ SOLN
INTRAMUSCULAR | Status: AC
  Filled 2019-06-08: qty 2

## 2019-06-08 MED ORDER — LACTATED RINGERS IV SOLN: INTRAVENOUS | Status: DC

## 2019-06-08 MED ORDER — DEXAMETHASONE SODIUM PHOSPHATE 10 MG/ML IJ SOLN
INTRAMUSCULAR | Status: AC
  Filled 2019-06-08: qty 1

## 2019-06-08 MED ORDER — KETOROLAC TROMETHAMINE 30 MG/ML IJ SOLN
INTRAMUSCULAR | Status: AC
  Filled 2019-06-08: qty 1

## 2019-06-08 MED ORDER — ONDANSETRON HCL 4 MG/2ML IJ SOLN: 4.0000 mg | Freq: Once | INTRAMUSCULAR | Status: DC | PRN

## 2019-06-08 MED ORDER — DEXAMETHASONE SODIUM PHOSPHATE 10 MG/ML IJ SOLN
INTRAMUSCULAR | Status: DC | PRN
  Administered 2019-06-08: 10 mg via INTRAVENOUS

## 2019-06-08 MED ORDER — METHYLERGONOVINE MALEATE 0.2 MG/ML IJ SOLN
INTRAMUSCULAR | Status: DC | PRN
  Administered 2019-06-08: .2 mg via INTRAMUSCULAR

## 2019-06-08 MED ORDER — FENTANYL CITRATE (PF) 100 MCG/2ML IJ SOLN: 25.0000 ug | INTRAMUSCULAR | Status: DC | PRN

## 2019-06-08 MED ORDER — SCOPOLAMINE 1 MG/3DAYS TD PT72: 1.0000 | MEDICATED_PATCH | TRANSDERMAL | Status: DC

## 2019-06-08 MED ORDER — LIDOCAINE 2% (20 MG/ML) 5 ML SYRINGE
INTRAMUSCULAR | Status: DC | PRN
  Administered 2019-06-08: 60 mg via INTRAVENOUS

## 2019-06-08 MED ORDER — CEFAZOLIN SODIUM-DEXTROSE 2-4 GM/100ML-% IV SOLN
INTRAVENOUS | Status: AC
  Filled 2019-06-08: qty 100

## 2019-06-08 MED ORDER — MEPERIDINE HCL 25 MG/ML IJ SOLN: 6.2500 mg | INTRAMUSCULAR | Status: DC | PRN

## 2019-06-08 MED ORDER — FENTANYL CITRATE (PF) 100 MCG/2ML IJ SOLN
INTRAMUSCULAR | Status: DC | PRN
  Administered 2019-06-08: 50 ug via INTRAVENOUS

## 2019-06-08 MED ORDER — DOXYCYCLINE HYCLATE 100 MG IV SOLR
200.0000 mg | Freq: Once | INTRAVENOUS | Status: AC
  Administered 2019-06-08: 200 mg via INTRAVENOUS
  Filled 2019-06-08: qty 200

## 2019-06-08 MED ORDER — SCOPOLAMINE 1 MG/3DAYS TD PT72
MEDICATED_PATCH | TRANSDERMAL | Status: AC
  Filled 2019-06-08: qty 1

## 2019-06-08 MED ORDER — KETOROLAC TROMETHAMINE 30 MG/ML IJ SOLN
INTRAMUSCULAR | Status: DC | PRN
  Administered 2019-06-08: 30 mg via INTRAVENOUS

## 2019-06-08 MED ORDER — LIDOCAINE 2% (20 MG/ML) 5 ML SYRINGE
INTRAMUSCULAR | Status: AC
  Filled 2019-06-08: qty 5

## 2019-06-08 MED ORDER — OXYCODONE HCL 5 MG/5ML PO SOLN: 5.0000 mg | Freq: Once | ORAL | Status: DC | PRN

## 2019-06-08 MED ORDER — PROPOFOL 10 MG/ML IV BOLUS
INTRAVENOUS | Status: AC
  Filled 2019-06-08: qty 20

## 2019-06-08 MED ORDER — CHLORHEXIDINE GLUCONATE 0.12 % MT SOLN: 15.0000 mL | Freq: Once | OROMUCOSAL | Status: DC

## 2019-06-08 MED ORDER — ARTIFICIAL TEARS OPHTHALMIC OINT
TOPICAL_OINTMENT | OPHTHALMIC | Status: AC
  Filled 2019-06-08: qty 3.5

## 2019-06-08 MED ORDER — ORAL CARE MOUTH RINSE: 15.0000 mL | Freq: Once | OROMUCOSAL | Status: DC

## 2019-06-08 MED ORDER — ONDANSETRON HCL 4 MG/2ML IJ SOLN
INTRAMUSCULAR | Status: AC
  Filled 2019-06-08: qty 2

## 2019-06-08 MED ORDER — ONDANSETRON HCL 4 MG/2ML IJ SOLN
INTRAMUSCULAR | Status: DC | PRN
  Administered 2019-06-08: 4 mg via INTRAVENOUS

## 2019-06-08 SURGICAL SUPPLY — 24 items
CATH ROBINSON RED A/P 16FR (CATHETERS) ×3 IMPLANT
CNTNR URN SCR LID CUP LEK RST (MISCELLANEOUS) IMPLANT
CONT SPEC 4OZ STRL OR WHT (MISCELLANEOUS)
DECANTER SPIKE VIAL GLASS SM (MISCELLANEOUS) ×3 IMPLANT
DILATOR CANAL MILEX (MISCELLANEOUS) IMPLANT
GLOVE BIOGEL PI IND STRL 7.0 (GLOVE) ×1 IMPLANT
GLOVE BIOGEL PI IND STRL 7.5 (GLOVE) IMPLANT
GLOVE BIOGEL PI INDICATOR 7.0 (GLOVE) ×2
GLOVE BIOGEL PI INDICATOR 7.5 (GLOVE) ×6
GLOVE ECLIPSE 6.0 STRL STRAW (GLOVE) ×3 IMPLANT
GOWN STRL REUS W/ TWL LRG LVL3 (GOWN DISPOSABLE) ×2 IMPLANT
GOWN STRL REUS W/TWL LRG LVL3 (GOWN DISPOSABLE) ×4
HOSE CONNECTING 18IN BERKELEY (TUBING) ×3 IMPLANT
KIT BERKELEY 1ST TRI 3/8 NO TR (MISCELLANEOUS) ×3 IMPLANT
KIT BERKELEY 1ST TRIMESTER 3/8 (MISCELLANEOUS) ×6 IMPLANT
NS IRRIG 1000ML POUR BTL (IV SOLUTION) ×3 IMPLANT
PACK VAGINAL MINOR WOMEN LF (CUSTOM PROCEDURE TRAY) ×3 IMPLANT
PAD OB MATERNITY 4.3X12.25 (PERSONAL CARE ITEMS) ×3 IMPLANT
SET BERKELEY SUCTION TUBING (SUCTIONS) ×3 IMPLANT
UNDERPAD 30X30 (UNDERPADS AND DIAPERS) ×3 IMPLANT
VACURETTE 10 RIGID CVD (CANNULA) IMPLANT
VACURETTE 7MM CVD STRL WRAP (CANNULA) IMPLANT
VACURETTE 8 RIGID CVD (CANNULA) IMPLANT
VACURETTE 9 RIGID CVD (CANNULA) ×2 IMPLANT

## 2019-06-08 NOTE — Anesthesia Procedure Notes (Signed)
Procedure Name: LMA Insertion Date/Time: 06/08/2019 7:37 AM Performed by: Norva Pavlov, CRNA Pre-anesthesia Checklist: Patient identified, Emergency Drugs available, Suction available and Patient being monitored Patient Re-evaluated:Patient Re-evaluated prior to induction Oxygen Delivery Method: Circle system utilized Preoxygenation: Pre-oxygenation with 100% oxygen Induction Type: IV induction Ventilation: Mask ventilation without difficulty LMA: LMA inserted LMA Size: 4.0 Number of attempts: 1 Airway Equipment and Method: Bite block Placement Confirmation: positive ETCO2 Tube secured with: Tape Dental Injury: Teeth and Oropharynx as per pre-operative assessment

## 2019-06-08 NOTE — H&P (Signed)
34 y.o. G2P1001 @ [redacted]w[redacted]d by LMP  presents for [facility] for missed abortion.  She was seen in the office on 5/20 and diagnosed with a miscarriage.  At that time, fetus was measuring [redacted]w[redacted]d with absent FCA.    Past Medical History:  Diagnosis Date  . GERD (gastroesophageal reflux disease)   . Medical history non-contributory     Past Surgical History:  Procedure Laterality Date  . NO PAST SURGERIES      OB History  Gravida Para Term Preterm AB Living  1 1 1     1   SAB TAB Ectopic Multiple Live Births        0 1    # Outcome Date GA Lbr Len/2nd Weight Sex Delivery Anes PTL Lv  1 Term 06/01/17 [redacted]w[redacted]d 20:11 / 04:30 3240 g M Vag-Spont EPI  LIV    Social History   Socioeconomic History  . Marital status: Married    Spouse name: Not on file  . Number of children: Not on file  . Years of education: Not on file  . Highest education level: Not on file  Occupational History  . Not on file  Tobacco Use  . Smoking status: Never Smoker  . Smokeless tobacco: Never Used  Substance and Sexual Activity  . Alcohol use: Never  . Drug use: Never  . Sexual activity: Yes  Other Topics Concern  . Not on file  Social History Narrative  . Not on file       Patient has no known allergies.   Other PNC: uncomplicated.  There were no vitals filed for this visit.   General:  NAD Abdomen:  soft Ex:  no edema FHTs:  Absent    A/P   34 y.o.  G2P1001 at [redacted]w[redacted]d for dilation and curettage for missed abortion   Elects for surgical management.  Discussed risks to include infection, bleeding, damage to surrounding structures (including but not limited to vagina, cervix, bladder, uterus), uterine perforation, need for additional procedures.  All questions answered and patient elects to proceed.  She desires Anora chromosome analysis given late gestational age of loss Rh: positive  Anishka Bushard GEFFEL [id number]

## 2019-06-08 NOTE — Discharge Instructions (Signed)
Please take motrin (ibuprofen) 600mg  every 6 hours as needed for pain.  If pain is not controlled, you may also take tylenol (acetaminophen) 1000mg  every 6 hours for pain.  Please call the office at [phone] if you have fever > 101 F, heavy vaginal bleeding (soaking through one pad in < 1hour for 2 or more hours in a row), uncontrolled pain or any other concerns.   No ibuprofen, Aleve, Motrin, Advil, or Naproxen until after 2:00pm today if needed.  DISCHARGE INSTRUCTIONS: D&C / D&E The following instructions have been prepared to help you care for yourself upon your return home.   Personal hygiene: Use sanitary pads for vaginal drainage, not tampons. . Shower the day after your procedure. . NO tub baths, pools or Jacuzzis for 2-3 weeks. . Wipe front to back after using the bathroom.  Activity and limitations: . Do NOT drive or operate any equipment for 24 hours. The effects of anesthesia are still present and drowsiness may result. . Do NOT rest in bed all day. . Walking is encouraged. . Walk up and down stairs slowly. . You may resume your normal activity in one to two days or as indicated by your physician.  Sexual activity: NO intercourse for at least 2 weeks after the procedure, or as indicated by your physician.  Diet: Eat a light meal as desired this evening. You may resume your usual diet tomorrow.  Return to work: You may resume your work activities in one to two days or as indicated by your doctor.  What to expect after your surgery: Expect to have vaginal bleeding/discharge for 2-3 days and spotting for up to 10 days. It is not unusual to have soreness for up to 1-2 weeks. You may have a slight burning sensation when you urinate for the first day. Mild cramps may continue for a couple of days. You may have a regular period in 2-6 weeks.  Call your doctor for any of the following: . Excessive vaginal bleeding, saturating and changing one pad every hour. . Inability to  urinate 6 hours after discharge from hospital. . Pain not relieved by pain medication. . Fever of 100.4 F or greater. . Unusual vaginal discharge or odor.    Post Anesthesia Home Care Instructions  Activity: Get plenty of rest for the remainder of the day. A responsible individual must stay with you for 24 hours following the procedure.  For the next 24 hours, DO NOT: -Drive a car -[phone] -Drink alcoholic beverages -Take any medication unless instructed by your physician -Make any legal decisions or sign important papers.  Meals: Start with liquid foods such as gelatin or soup. Progress to regular foods as tolerated. Avoid greasy, spicy, heavy foods. If nausea and/or vomiting occur, drink only clear liquids until the nausea and/or vomiting subsides. Call your physician if vomiting continues.  Special Instructions/Symptoms: Your throat may feel dry or sore from the anesthesia or the breathing tube placed in your throat during surgery. If this causes discomfort, gargle with warm salt water. The discomfort should disappear within 24 hours.

## 2019-06-08 NOTE — Transfer of Care (Signed)
  Last Vitals:  Vitals Value Taken Time  BP 104/66 06/08/19 0804  Temp 36.3 C 06/08/19 0804  Pulse 59 06/08/19 0811  Resp 10 06/08/19 0811  SpO2 100 % 06/08/19 0811  Vitals shown include unvalidated device data.  Last Pain:  Vitals:   06/08/19 0804  TempSrc:   PainSc: Asleep        Immediate Anesthesia Transfer of Care Note  Patient: Jamie [name]  Procedure(s) Performed: Procedure(s) (LRB): DILATATION AND EVACUATION (N/A) CHROMOSOME STUDIES (N/A)  Patient Location: PACU  Anesthesia Type: General  Level of Consciousness: awake, alert  and oriented  Airway & Oxygen Therapy: Patient Spontanous Breathing and Patient connected to face mask oxygen  Post-op Assessment: Report given to PACU RN and Post -op Vital signs reviewed and stable  Post vital signs: Reviewed and stable  Complications: No apparent anesthesia complications

## 2019-06-08 NOTE — Anesthesia Postprocedure Evaluation (Signed)
Anesthesia Post Note  Patient: Shanterria Pfiester  Procedure(s) Performed: DILATATION AND EVACUATION (N/A Vagina ) CHROMOSOME STUDIES (N/A Vagina )     Patient location during evaluation: PACU Anesthesia Type: General Level of consciousness: awake and alert and oriented Pain management: pain level controlled Vital Signs Assessment: post-procedure vital signs reviewed and stable Respiratory status: spontaneous breathing, nonlabored ventilation and respiratory function stable Cardiovascular status: blood pressure returned to baseline and stable Postop Assessment: no apparent nausea or vomiting Anesthetic complications: no    Last Vitals:  Vitals:   06/08/19 0804 06/08/19 0815  BP: 104/66 105/63  Pulse: 65 70  Resp: 13 16  Temp: (!) 36.3 C   SpO2: 100% 100%    Last Pain:  Vitals:   06/08/19 0830  TempSrc:   PainSc: 0-No pain                 Kirtis Challis A.

## 2019-06-08 NOTE — Op Note (Signed)
Pre-Operative Diagnosis: Missed abortion at 11 weeks  Postoperative Diagnosis: Missed abortion at 11 weeks  Procedure: Dilation and curettage  Surgeon: Marlow Baars, MD  Operative Findings: anteverted uterus, 9 week size  Specimen: products of conception  EBL: 50 cc   The patient was noted to have a missed abortion at 11 weeks, with crown rump length measuring 9 weeks.  She elected for surgical management.  Risks, benefits, alternatives to the procedure were discussed with the aid of the [place] interpreter.  Consent was signed and she elected to proceed.    After adequate anesthesia was achieved, the patient placed in the dorsal lithotomy position in Graham stirrups.  She was prepped and draped in the usual sterile fashion.  The bivalve speculum was placed in the vagina and the anterior lip of the cervix grasped with a single-tooth tenaculum.  The cervix was serially dilated with Hank dilators.  A 9 mm suction curette was advanced to the fundus, the vacuum was engaged, and multiple suction passes were performed until the products of conception were evacuated.  Bleeding was slightly more than anticipated at 71mL, so 0.2 mg of IM methergine was given.  Uterine tone was firm and bleeding was scant following completion of the procedure.  A Sharp curettage was performed and a gritty texture was noted. A final suction pass was performed with minimal results. This completed the procedure. A bimanual massage was performed and confirmed good uterine tone.  All instruments were removed from the vagina.  Pressure was used to achieve hemostasis at the tenaculum site. The patient tolerated the procedure well was brought to the recovery room in stable condition for the procedure. All sponge and needle counts correct x2.  The patient desired genetic analysis, so all products of conception were sent for anora chromosome analysis.     [place], [facility]

## 2019-10-13 LAB — OB RESULTS CONSOLE HIV ANTIBODY (ROUTINE TESTING): HIV: NONREACTIVE

## 2019-10-13 LAB — OB RESULTS CONSOLE HEPATITIS B SURFACE ANTIGEN: Hepatitis B Surface Ag: NEGATIVE

## 2019-10-13 LAB — OB RESULTS CONSOLE RUBELLA ANTIBODY, IGM: Rubella: IMMUNE

## 2019-10-13 LAB — OB RESULTS CONSOLE RPR: RPR: NONREACTIVE

## 2019-10-13 LAB — HEPATITIS C ANTIBODY: HCV Ab: NEGATIVE

## 2020-01-15 NOTE — L&D Delivery Note (Signed)
Patient was C/C/+1 and pushed for approximately 30 minutes with epidural.    NSVD female infant, Apgars 8/9, weight pending.   The patient had no laceration. Fundus was firm. EBL was expected amount. Placenta was delivered intact. Vagina was clear.  Delayed cord clamping done for 30-60 seconds while warming baby. Baby was vigorous and doing skin to skin with mother.  Jamie [name]

## 2020-02-17 LAB — OB RESULTS CONSOLE RPR: RPR: NONREACTIVE

## 2020-04-07 ENCOUNTER — Inpatient Hospital Stay (HOSPITAL_COMMUNITY)
Admission: AD | Admit: 2020-04-07 | Discharge: 2020-04-07 | Disposition: A | Discharge status: Home / Self Care | Payer: BC Managed Care – PPO | Attending: Obstetrics and Gynecology | Admitting: Obstetrics and Gynecology

## 2020-04-07 ENCOUNTER — Other Ambulatory Visit: Payer: Self-pay

## 2020-04-07 ENCOUNTER — Encounter (HOSPITAL_COMMUNITY): Payer: Self-pay | Admitting: Obstetrics and Gynecology

## 2020-04-07 DIAGNOSIS — Z3A34 34 weeks gestation of pregnancy: Secondary | ICD-10-CM

## 2020-04-07 DIAGNOSIS — Z3689 Encounter for other specified antenatal screening: Secondary | ICD-10-CM | POA: Diagnosis present

## 2020-04-07 DIAGNOSIS — O365930 Maternal care for other known or suspected poor fetal growth, third trimester, not applicable or unspecified: Secondary | ICD-10-CM | POA: Diagnosis not present

## 2020-04-07 NOTE — MAU Note (Signed)
Jamie [name] is a 35 y.o. at [redacted]w[redacted]d here in MAU reporting: was seen in the office today and they sent her over because they could not detect the breath of the baby. +FM. No pain, bleeding, or LOF.  Onset of complaint: today  Pain score: 0/10  Vitals:   04/07/20 1541  BP: 107/64  Pulse: 85  Resp: 16  Temp: 98.4 F (36.9 C)  SpO2: 99%     FHT:120  Lab orders placed from triage: none

## 2020-04-07 NOTE — Discharge Instructions (Signed)
Fetal Movement Counts Patient Name: ________________________________________________ Patient Due Date: ____________________  What is a fetal movement count? A fetal movement count is the number of times that you feel your baby move during a certain amount of time. This may also be called a fetal kick count. A fetal movement count is recommended for every pregnant woman. You may be asked to start counting fetal movements as early as week 28 of your pregnancy. Pay attention to when your baby is most active. You may notice your baby's sleep and wake cycles. You may also notice things that make your baby move more. You should do a fetal movement count:  When your baby is normally most active.  At the same time each day. A good time to count movements is while you are resting, after having something to eat and drink. How do I count fetal movements? 1. Find a quiet, comfortable area. Sit, or lie down on your side. 2. Write down the date, the start time and stop time, and the number of movements that you felt between those two times. Take this information with you to your health care visits. 3. Write down your start time when you feel the first movement. 4. Count kicks, flutters, swishes, rolls, and jabs. You should feel at least 10 movements. 5. You may stop counting after you have felt 10 movements, or if you have been counting for 2 hours. Write down the stop time. 6. If you do not feel 10 movements in 2 hours, contact your health care provider for further instructions. Your health care provider may want to do additional tests to assess your baby's well-being. Contact a health care provider if:  You feel fewer than 10 movements in 2 hours.  Your baby is not moving like he or she usually does. Date: ____________ Start time: ____________ Stop time: ____________ Movements: ____________ Date: ____________ Start time: ____________ Stop time: ____________ Movements: ____________ Date: ____________  Start time: ____________ Stop time: ____________ Movements: ____________ Date: ____________ Start time: ____________ Stop time: ____________ Movements: ____________ Date: ____________ Start time: ____________ Stop time: ____________ Movements: ____________ Date: ____________ Start time: ____________ Stop time: ____________ Movements: ____________ Date: ____________ Start time: ____________ Stop time: ____________ Movements: ____________ Date: ____________ Start time: ____________ Stop time: ____________ Movements: ____________ Date: ____________ Start time: ____________ Stop time: ____________ Movements: ____________ This information is not intended to replace advice given to you by your health care provider. Make sure you discuss any questions you have with your health care provider. Document Revised: 08/20/2018 Document Reviewed: 08/20/2018 Elsevier Patient Education  2021 Elsevier Inc.  

## 2020-04-07 NOTE — MAU Provider Note (Signed)
History     CSN: [id number]  Arrival date and time: 04/07/20 1528   Event Date/Time   First Provider Initiated Contact with Patient 04/07/20 1609      Chief Complaint  Patient presents with  . Ultrasound   34 y.o. G3P1011 @34 .4 wks sent from office for NST after BPP 6/8. Pt reports good FM. Denies bleeding, leaking, or ctx. Her pregnancy is complicated by IUGR.    OB History    Gravida  3   Para  1   Term  1   Preterm      AB  1   Living  1     SAB  1   IAB      Ectopic      Multiple  0   Live Births  1           Past Medical History:  Diagnosis Date  . GERD (gastroesophageal reflux disease)   . Medical history non-contributory     Past Surgical History:  Procedure Laterality Date  . DILATION AND EVACUATION N/A 06/08/2019   Procedure: DILATATION AND EVACUATION;  Surgeon: 06/10/2019, MD;  Location: [facility];  Service: Gynecology;  Laterality: N/A;  . NO PAST SURGERIES      History reviewed. No pertinent family history.  Social History   Tobacco Use  . Smoking status: Never Smoker  . Smokeless tobacco: Never Used  Vaping Use  . Vaping Use: Never used  Substance Use Topics  . Alcohol use: Never  . Drug use: Never    Allergies: No Known Allergies  Medications Prior to Admission  Medication Sig Dispense Refill Last Dose  . Prenatal Vit-Fe Fumarate-FA (PRENATAL MULTIVITAMIN) TABS tablet Take 1 tablet by mouth daily at 12 noon.   04/06/2020 at Unknown time    Review of Systems  Gastrointestinal: Negative for abdominal pain.  Genitourinary: Negative for vaginal bleeding.   Physical Exam   Blood pressure 107/64, pulse 85, temperature 98.4 F (36.9 C), temperature source Oral, resp. rate 16, SpO2 99 %, unknown if currently breastfeeding.  Physical Exam Vitals and nursing note reviewed.  Constitutional:      General: She is not in acute distress.    Appearance: Normal appearance.  HENT:     Head: Normocephalic and  atraumatic.  Cardiovascular:     Rate and Rhythm: Normal rate.  Pulmonary:     Effort: Pulmonary effort is normal. No respiratory distress.  Musculoskeletal:        General: Normal range of motion.     Cervical back: Normal range of motion.  Neurological:     General: No focal deficit present.     Mental Status: She is oriented to person, place, and time.  Psychiatric:        Mood and Affect: Mood normal.        Behavior: Behavior normal.   EFM: 130 bpm, mod variability, + accels, no decels Toco: none  MAU Course  Procedures Prolonged EFM  MDM Pregnancy complicated by IUGR with BPP 6/8 (off for breathing) and normal dopplers today per Dr. 04/08/2020. NST reactive. Good FM detected and marked on NST. Stable for discharge home.   Assessment and Plan   1. [redacted] weeks gestation of pregnancy   2. NST (non-stress test) reactive    Discharge home Follow up at [facility] next week [facility]  Allergies as of 04/07/2020   No Known Allergies     Medication List    TAKE these  medications   prenatal multivitamin Tabs tablet Take 1 tablet by mouth daily at 12 noon.      Audio interpreter present for encounter  Donette Larry, CNM 04/07/2020, 4:14 PM

## 2020-04-21 LAB — OB RESULTS CONSOLE GBS: GBS: NEGATIVE

## 2020-05-08 ENCOUNTER — Inpatient Hospital Stay (HOSPITAL_COMMUNITY)
Admission: AD | Admit: 2020-05-08 | Discharge: 2020-05-10 | DRG: 807 | Disposition: A | Discharge status: Home / Self Care | Payer: BC Managed Care – PPO | Attending: Obstetrics and Gynecology | Admitting: Obstetrics and Gynecology

## 2020-05-08 ENCOUNTER — Other Ambulatory Visit: Payer: Self-pay

## 2020-05-08 ENCOUNTER — Inpatient Hospital Stay (HOSPITAL_COMMUNITY): Payer: BC Managed Care – PPO | Admitting: Anesthesiology

## 2020-05-08 ENCOUNTER — Inpatient Hospital Stay (HOSPITAL_COMMUNITY): Payer: BC Managed Care – PPO

## 2020-05-08 ENCOUNTER — Inpatient Hospital Stay (HOSPITAL_COMMUNITY): Admit: 2020-05-08 | Payer: BC Managed Care – PPO | Source: Home / Self Care

## 2020-05-08 ENCOUNTER — Encounter (HOSPITAL_COMMUNITY): Payer: Self-pay | Admitting: Obstetrics & Gynecology

## 2020-05-08 DIAGNOSIS — O365930 Maternal care for other known or suspected poor fetal growth, third trimester, not applicable or unspecified: Principal | ICD-10-CM | POA: Diagnosis present

## 2020-05-08 DIAGNOSIS — Z3A39 39 weeks gestation of pregnancy: Secondary | ICD-10-CM | POA: Diagnosis not present

## 2020-05-08 DIAGNOSIS — Z20822 Contact with and (suspected) exposure to covid-19: Secondary | ICD-10-CM | POA: Diagnosis present

## 2020-05-08 DIAGNOSIS — O365990 Maternal care for other known or suspected poor fetal growth, unspecified trimester, not applicable or unspecified: Secondary | ICD-10-CM | POA: Diagnosis present

## 2020-05-08 LAB — CBC
HCT: 36.2 % (ref 36.0–46.0)
Hemoglobin: 11.8 g/dL — ABNORMAL LOW (ref 12.0–15.0)
MCH: 30.2 pg (ref 26.0–34.0)
MCHC: 32.6 g/dL (ref 30.0–36.0)
MCV: 92.6 fL (ref 80.0–100.0)
Platelets: 158 10*3/uL (ref 150–400)
RBC: 3.91 MIL/uL (ref 3.87–5.11)
RDW: 18.9 % — ABNORMAL HIGH (ref 11.5–15.5)
WBC: 8.6 10*3/uL (ref 4.0–10.5)
nRBC: 0 % (ref 0.0–0.2)

## 2020-05-08 LAB — RPR: RPR Ser Ql: NONREACTIVE

## 2020-05-08 LAB — TYPE AND SCREEN
ABO/RH(D): O POS
Antibody Screen: NEGATIVE

## 2020-05-08 LAB — RESP PANEL BY RT-PCR (FLU A&B, COVID) ARPGX2
Influenza A by PCR: NEGATIVE
Influenza B by PCR: NEGATIVE
SARS Coronavirus 2 by RT PCR: NEGATIVE

## 2020-05-08 MED ORDER — DIPHENHYDRAMINE HCL 25 MG PO CAPS: 25.0000 mg | ORAL_CAPSULE | Freq: Four times a day (QID) | ORAL | Status: DC | PRN

## 2020-05-08 MED ORDER — ZOLPIDEM TARTRATE 5 MG PO TABS: 5.0000 mg | ORAL_TABLET | Freq: Every evening | ORAL | Status: DC | PRN

## 2020-05-08 MED ORDER — SENNOSIDES-DOCUSATE SODIUM 8.6-50 MG PO TABS
2.0000 | ORAL_TABLET | Freq: Every day | ORAL | Status: DC
  Administered 2020-05-09: 2 via ORAL
  Filled 2020-05-08: qty 2

## 2020-05-08 MED ORDER — ONDANSETRON HCL 4 MG/2ML IJ SOLN
4.0000 mg | Freq: Four times a day (QID) | INTRAMUSCULAR | Status: DC | PRN
  Administered 2020-05-08: 4 mg via INTRAVENOUS
  Filled 2020-05-08: qty 2

## 2020-05-08 MED ORDER — COCONUT OIL OIL: 1.0000 "application " | TOPICAL_OIL | Status: DC | PRN

## 2020-05-08 MED ORDER — OXYCODONE-ACETAMINOPHEN 5-325 MG PO TABS: 2.0000 | ORAL_TABLET | ORAL | Status: DC | PRN

## 2020-05-08 MED ORDER — TERBUTALINE SULFATE 1 MG/ML IJ SOLN: 0.2500 mg | Freq: Once | INTRAMUSCULAR | Status: DC | PRN

## 2020-05-08 MED ORDER — OXYCODONE HCL 5 MG PO TABS: 10.0000 mg | ORAL_TABLET | ORAL | Status: DC | PRN

## 2020-05-08 MED ORDER — LACTATED RINGERS IV SOLN
500.0000 mL | Freq: Once | INTRAVENOUS | Status: AC
  Administered 2020-05-08: 500 mL via INTRAVENOUS

## 2020-05-08 MED ORDER — ONDANSETRON HCL 4 MG/2ML IJ SOLN: 4.0000 mg | INTRAMUSCULAR | Status: DC | PRN

## 2020-05-08 MED ORDER — DIBUCAINE (PERIANAL) 1 % EX OINT: 1.0000 "application " | TOPICAL_OINTMENT | CUTANEOUS | Status: DC | PRN

## 2020-05-08 MED ORDER — ACETAMINOPHEN 325 MG PO TABS: 650.0000 mg | ORAL_TABLET | ORAL | Status: DC | PRN

## 2020-05-08 MED ORDER — LACTATED RINGERS IV SOLN: 500.0000 mL | INTRAVENOUS | Status: DC | PRN

## 2020-05-08 MED ORDER — OXYTOCIN-SODIUM CHLORIDE 30-0.9 UT/500ML-% IV SOLN
2.5000 [IU]/h | INTRAVENOUS | Status: DC
  Filled 2020-05-08: qty 500

## 2020-05-08 MED ORDER — LIDOCAINE HCL (PF) 1 % IJ SOLN: 30.0000 mL | INTRAMUSCULAR | Status: DC | PRN

## 2020-05-08 MED ORDER — LACTATED RINGERS IV SOLN: INTRAVENOUS | Status: DC

## 2020-05-08 MED ORDER — TETANUS-DIPHTH-ACELL PERTUSSIS 5-2.5-18.5 LF-MCG/0.5 IM SUSY: 0.5000 mL | PREFILLED_SYRINGE | Freq: Once | INTRAMUSCULAR | Status: DC

## 2020-05-08 MED ORDER — WITCH HAZEL-GLYCERIN EX PADS: 1.0000 "application " | MEDICATED_PAD | CUTANEOUS | Status: DC | PRN

## 2020-05-08 MED ORDER — PHENYLEPHRINE 40 MCG/ML (10ML) SYRINGE FOR IV PUSH (FOR BLOOD PRESSURE SUPPORT)
80.0000 ug | PREFILLED_SYRINGE | INTRAVENOUS | Status: DC | PRN
  Filled 2020-05-08: qty 10

## 2020-05-08 MED ORDER — OXYCODONE HCL 5 MG PO TABS
5.0000 mg | ORAL_TABLET | ORAL | Status: DC | PRN
  Administered 2020-05-09: 5 mg via ORAL
  Filled 2020-05-08: qty 1

## 2020-05-08 MED ORDER — OXYTOCIN BOLUS FROM INFUSION
333.0000 mL | Freq: Once | INTRAVENOUS | Status: AC
  Administered 2020-05-08: 333 mL via INTRAVENOUS

## 2020-05-08 MED ORDER — ONDANSETRON HCL 4 MG PO TABS: 4.0000 mg | ORAL_TABLET | ORAL | Status: DC | PRN

## 2020-05-08 MED ORDER — IBUPROFEN 600 MG PO TABS
600.0000 mg | ORAL_TABLET | Freq: Four times a day (QID) | ORAL | Status: DC
  Administered 2020-05-08 – 2020-05-10 (×7): 600 mg via ORAL
  Filled 2020-05-08 (×7): qty 1

## 2020-05-08 MED ORDER — PRENATAL MULTIVITAMIN CH
1.0000 | ORAL_TABLET | Freq: Every day | ORAL | Status: DC
  Administered 2020-05-09: 1 via ORAL
  Filled 2020-05-08: qty 1

## 2020-05-08 MED ORDER — PHENYLEPHRINE 40 MCG/ML (10ML) SYRINGE FOR IV PUSH (FOR BLOOD PRESSURE SUPPORT): 80.0000 ug | PREFILLED_SYRINGE | INTRAVENOUS | Status: DC | PRN

## 2020-05-08 MED ORDER — EPHEDRINE 5 MG/ML INJ: 10.0000 mg | INTRAVENOUS | Status: DC | PRN

## 2020-05-08 MED ORDER — OXYTOCIN-SODIUM CHLORIDE 30-0.9 UT/500ML-% IV SOLN: 1.0000 m[IU]/min | INTRAVENOUS | Status: DC

## 2020-05-08 MED ORDER — DIPHENHYDRAMINE HCL 50 MG/ML IJ SOLN: 12.5000 mg | INTRAMUSCULAR | Status: DC | PRN

## 2020-05-08 MED ORDER — SOD CITRATE-CITRIC ACID 500-334 MG/5ML PO SOLN
30.0000 mL | ORAL | Status: DC | PRN
  Filled 2020-05-08: qty 15

## 2020-05-08 MED ORDER — FENTANYL-BUPIVACAINE-NACL 0.5-0.125-0.9 MG/250ML-% EP SOLN
12.0000 mL/h | EPIDURAL | Status: DC | PRN
  Administered 2020-05-08: 12 mL/h via EPIDURAL
  Filled 2020-05-08: qty 250

## 2020-05-08 MED ORDER — MISOPROSTOL 25 MCG QUARTER TABLET
25.0000 ug | ORAL_TABLET | ORAL | Status: DC | PRN
  Administered 2020-05-08 (×2): 25 ug via VAGINAL
  Filled 2020-05-08 (×2): qty 1

## 2020-05-08 MED ORDER — FENTANYL CITRATE (PF) 100 MCG/2ML IJ SOLN
50.0000 ug | INTRAMUSCULAR | Status: DC | PRN
Start: 2020-05-08 — End: 2020-05-08
  Administered 2020-05-08 (×2): 100 ug via INTRAVENOUS
  Filled 2020-05-08 (×2): qty 2

## 2020-05-08 MED ORDER — BENZOCAINE-MENTHOL 20-0.5 % EX AERO: 1.0000 "application " | INHALATION_SPRAY | CUTANEOUS | Status: DC | PRN

## 2020-05-08 MED ORDER — LIDOCAINE HCL (PF) 1 % IJ SOLN
INTRAMUSCULAR | Status: DC | PRN
  Administered 2020-05-08: 10 mL via EPIDURAL

## 2020-05-08 MED ORDER — OXYCODONE-ACETAMINOPHEN 5-325 MG PO TABS: 1.0000 | ORAL_TABLET | ORAL | Status: DC | PRN

## 2020-05-08 MED ORDER — SIMETHICONE 80 MG PO CHEW: 80.0000 mg | CHEWABLE_TABLET | ORAL | Status: DC | PRN

## 2020-05-08 MED ORDER — ACETAMINOPHEN 325 MG PO TABS
650.0000 mg | ORAL_TABLET | ORAL | Status: DC | PRN
  Administered 2020-05-09: 650 mg via ORAL
  Filled 2020-05-08: qty 2

## 2020-05-08 NOTE — Plan of Care (Signed)
Mileigh Tilley, RN 

## 2020-05-08 NOTE — Lactation Note (Signed)
This note was copied from a baby's chart. Lactation Consultation Note  Patient Name: Jamie [name] [id number] Date: 05/08/2020 Reason for consult: Initial assessment;Mother's request;Term Age:35 hours  Mom stated infant latched at the breast at 4 pm for 10 minutes. Mom pumped/ bottle fed infant breastmilk and formula for one year. She plans on doing the same with this baby.  At the time of the visit, Mom trying to eat some food and preferred infant to receive formula supplementation.   Mom has inverted nipples but states she was able to latch in LD. LC setup a manual pump, pre pump 5-10 minutes before latching.   LC reviewed with Dad how to pace bottle feed and reviewed breastfeeding supplementation guide based on hrs of age since birth.   All questions answered at the end of the visit.   Maternal Data Has patient been taught Hand Expression?: Yes Does the patient have breastfeeding experience prior to this delivery?: Yes How long did the patient breastfeed?: 1 year  Mom both pumped and breast fed supplementing with formula  Feeding Mother's Current Feeding Choice: Breast Milk and Formula  LATCH Score                  Lactation Tools Discussed/Used Tools: Flanges;Pump Flange Size: 24 Breast pump type: Manual Pump Education: Setup, frequency, and cleaning;Milk Storage Reason for Pumping: inverted nipples Pumping frequency: pre pump 5-10 minutes before latching  Interventions Interventions: Breast feeding basics reviewed;Education;Position options;Skin to skin;Expressed milk;Hand express;Pre-pump if needed;Hand pump;Breast compression  Discharge Pump: Personal WIC Program: Yes  Consult Status Consult Status: Follow-up Date: 05/09/20 Follow-up type: In-patient    Jamie [name]  Jamie [name] 05/08/2020, 7:09 PM

## 2020-05-08 NOTE — Anesthesia Postprocedure Evaluation (Signed)
Anesthesia Post Note  Patient: Skylar Graven  Procedure(s) Performed: AN AD HOC LABOR EPIDURAL     Patient location during evaluation: Mother Baby Anesthesia Type: Epidural Level of consciousness: awake and alert Pain management: pain level controlled Respiratory status: spontaneous breathing Cardiovascular status: stable Postop Assessment: no headache, adequate PO intake, no backache, patient able to bend at knees, able to ambulate, epidural receding and no apparent nausea or vomiting Anesthetic complications: no   No complications documented.  Last Vitals:  Vitals:   05/08/20 1630 05/08/20 1700  BP: 133/73 110/64  Pulse: (!) 54 (!) 52  Resp: 16 20  Temp:  36.5 C  SpO2:  100%    Last Pain:  Vitals:   05/08/20 1700  TempSrc:   PainSc: 0-No pain   Pain Goal:                   Salome Arnt

## 2020-05-08 NOTE — H&P (Addendum)
35 y.o. [redacted]w[redacted]d  G3P1011 comes in for schedule IOL due to FGR by [facility] <5% at [facility]. Patient has been undergoing weekly ANT with normal UADs.   Otherwise has good fetal movement and no bleeding.  Past Medical History:  Diagnosis Date  . GERD (gastroesophageal reflux disease)   . Medical history non-contributory     Past Surgical History:  Procedure Laterality Date  . DILATION AND EVACUATION N/A 06/08/2019   Procedure: DILATATION AND EVACUATION;  Surgeon: Marlow Baars, MD;  Location: [facility];  Service: Gynecology;  Laterality: N/A;  . NO PAST SURGERIES      OB History  Gravida Para Term Preterm AB Living  3 1 1   1 1   SAB IAB Ectopic Multiple Live Births  1     0 1    # Outcome Date GA Lbr Len/2nd Weight Sex Delivery Anes PTL Lv  3 Current           2 SAB 2021          1 Term 06/01/17 [redacted]w[redacted]d 20:11 / 04:30 3240 g M Vag-Spont EPI  LIV    Social History   Socioeconomic History  . Marital status: Married    Spouse name: Not on file  . Number of children: Not on file  . Years of education: Not on file  . Highest education level: Not on file  Occupational History  . Not on file  Tobacco Use  . Smoking status: Never Smoker  . Smokeless tobacco: Never Used  Vaping Use  . Vaping Use: Never used  Substance and Sexual Activity  . Alcohol use: Never  . Drug use: Never  . Sexual activity: Yes  Other Topics Concern  . Not on file  Social History Narrative  . Not on file   Social Determinants of Health   Financial Resource Strain: Not on file  Food Insecurity: Not on file  Transportation Needs: Not on file  Physical Activity: Not on file  Stress: Not on file  Social Connections: Not on file  Intimate Partner Violence: Not on file   Patient has no known allergies.    Prenatal Transfer Tool  Maternal Diabetes: No Genetic Screening: Declined Maternal Ultrasounds/Referrals: IUGR Fetal Ultrasounds or other Referrals:  Referred to [facility] , most  recent growth scan 05/05/2020: 6#5, 2851g (15%) Maternal Substance Abuse:  No Significant Maternal Medications:  None Significant Maternal Lab Results: Group B Strep negative  Other PNC: onging eval for FGR    Vitals:   05/08/20 0341 05/08/20 0400 05/08/20 0600 05/08/20 0631  BP: 114/72 103/68 106/75 110/70  Pulse: 75 62 72 71  Resp:    16  Temp:      Weight:      Height:        Physical Exam: NAD, gravid nontender abd SVE:  0.5/thick/-2 per RN FHTs:  115, good STV, NST R; Cat 1 tracing. Toco:  q 1-3   A/P   Admitted for IOL given FGR with normal ANT  GBS Neg  cytotec for cervical ripening, anticipate AROM and pitocin, possible FB Other routine care  05/10/20

## 2020-05-08 NOTE — Anesthesia Preprocedure Evaluation (Signed)
Anesthesia Evaluation  Patient identified by MRN, date of birth, ID band Patient awake    Reviewed: Allergy & Precautions, H&P , NPO status , Patient's Chart, lab work & pertinent test results  History of Anesthesia Complications Negative for: history of anesthetic complications  Airway Mallampati: II  TM Distance: >3 FB Neck ROM: full    Dental no notable dental hx.    Pulmonary neg pulmonary ROS,    Pulmonary exam normal        Cardiovascular negative cardio ROS Normal cardiovascular exam Rhythm:regular Rate:Normal     Neuro/Psych negative neurological ROS  negative psych ROS   GI/Hepatic Neg liver ROS, GERD  ,  Endo/Other  negative endocrine ROS  Renal/GU negative Renal ROS  negative genitourinary   Musculoskeletal   Abdominal   Peds  Hematology negative hematology ROS (+)   Anesthesia Other Findings   Reproductive/Obstetrics (+) Pregnancy                             Anesthesia Physical Anesthesia Plan  ASA: II  Anesthesia Plan: Epidural   Post-op Pain Management:    Induction:   PONV Risk Score and Plan:   Airway Management Planned:   Additional Equipment:   Intra-op Plan:   Post-operative Plan:   Informed Consent: I have reviewed the patients History and Physical, chart, labs and discussed the procedure including the risks, benefits and alternatives for the proposed anesthesia with the patient or authorized representative who has indicated his/her understanding and acceptance.       Plan Discussed with:   Anesthesia Plan Comments:         Anesthesia Quick Evaluation  

## 2020-05-08 NOTE — Anesthesia Procedure Notes (Signed)
Epidural Patient location during procedure: OB Start time: 05/08/2020 10:18 AM End time: 05/08/2020 10:27 AM  Staffing Anesthesiologist: Lucretia Kern, MD Performed: anesthesiologist   Preanesthetic Checklist Completed: patient identified, IV checked, risks and benefits discussed, monitors and equipment checked, pre-op evaluation and timeout performed  Epidural Patient position: sitting Prep: DuraPrep Patient monitoring: heart rate, continuous pulse ox and blood pressure Approach: midline Location: L3-L4 Injection technique: LOR air  Needle:  Needle type: Tuohy  Needle gauge: 17 G Needle length: 9 cm Needle insertion depth: 4.5 cm Catheter type: closed end flexible Catheter size: 19 Gauge Catheter at skin depth: 10 cm Test dose: negative  Assessment Events: blood not aspirated, injection not painful, no injection resistance, no paresthesia and negative IV test  Additional Notes Reason for block:procedure for pain

## 2020-05-08 NOTE — Lactation Note (Signed)
Lactation Consultation Note  Patient Name: Jamie [name] LSLHT'D Date: 05/08/2020 Reason for consult: L&D Initial assessment Age:35 y.o.   L & D Initial Visit:  Visited with mother < 1 hour after birth Baby awake and alert; offered to assist with latching and mother interested.  Assisted to latch easily and baby began actively sucking.  Demonstrated positioning and breast compressions.  Reassured mother that lactation services will be available on the M/B unit.  Allowed time for family bonding.  Support person present.   Maternal Data    Feeding Mother's Current Feeding Choice: Breast Milk  LATCH Score Latch: Grasps breast easily, tongue down, lips flanged, rhythmical sucking.  Audible Swallowing: None  Type of Nipple: Everted at rest and after stimulation  Comfort (Breast/Nipple): Soft / non-tender  Hold (Positioning): Assistance needed to correctly position infant at breast and maintain latch.  LATCH Score: 7   Lactation Tools Discussed/Used    Interventions Interventions: Assisted with latch;Skin to skin  Discharge    Consult Status Consult Status: Follow-up Date: 05/08/20 Follow-up type: In-patient    Dora Sims 05/08/2020, 3:42 PM

## 2020-05-09 LAB — CBC
HCT: 36.5 % (ref 36.0–46.0)
Hemoglobin: 12 g/dL (ref 12.0–15.0)
MCH: 30.6 pg (ref 26.0–34.0)
MCHC: 32.9 g/dL (ref 30.0–36.0)
MCV: 93.1 fL (ref 80.0–100.0)
Platelets: 133 10*3/uL — ABNORMAL LOW (ref 150–400)
RBC: 3.92 MIL/uL (ref 3.87–5.11)
RDW: 18.9 % — ABNORMAL HIGH (ref 11.5–15.5)
WBC: 10.3 10*3/uL (ref 4.0–10.5)
nRBC: 0 % (ref 0.0–0.2)

## 2020-05-09 NOTE — Progress Notes (Signed)
Patient is doing well.  She is ambulating, voiding, tolerating PO.  Pain control is good--mostly mild LBP.  Lochia is appropriate  Vitals:   05/08/20 1820 05/08/20 2200 05/09/20 0200 05/09/20 0630  BP: 111/69 96/60 102/60 98/62  Pulse: (!) 58 (!) 56 60 (!) 57  Resp: 20 18 18 16   Temp: 98.3 F (36.8 C) 98.3 F (36.8 C) 98 F (36.7 C) 98.2 F (36.8 C)  TempSrc: Oral Oral Oral Oral  SpO2: 100%     Weight:      Height:        NAD Fundus firm Ext: no edema  Lab Results  Component Value Date   WBC 10.3 05/09/2020   HGB 12.0 05/09/2020   HCT 36.5 05/09/2020   MCV 93.1 05/09/2020   PLT 133 (L) 05/09/2020    --/--/O POS (04/25 0042)/RImmune  A/P 34 y.o. 12-30-1989 PPD#1. Routine care.   Likely desires neonatal circumcision.  Wants to discuss with partner.  Will address later today.  Is undecided on discharge today vs tomorrow.    [facility]

## 2020-05-09 NOTE — Lactation Note (Signed)
This note was copied from a baby's chart. Lactation Consultation Note  Patient Name: Jamie [name] [id number] Date: 05/09/2020 Reason for consult: Follow-up assessment;Mother's request;Difficult latch;Term Age:35 hours   Infant came back form circ, tired been 4 hrs since his last feeding. LC assisted Mom latching at the breast. Mom has inverted nipples, she has been pre pumping with manual pump 10 minutes before latching. According to Mom infant only latching 5 minutes and falls asleep.   Infant latched with help of 20 NS primed with 0.5 ml of formula but once NS empty would not maintain suck. LC tried breast compression and different positions, infant looking for faster flow. Mom offer Similac with extra slow flow nipple paced bottle feeding 20 ml.   DEBP set up with instructions to pump q 3hrs for 15 minutes after latching. Mom aware any EBM collected offer first before formula. Breastfeeding supplementation guide reviewed, Mom to offer more if infant unable to latch and is still hungry.   Plan 1. To feed based on cues 8-12x in 24 hr period, no more than 3hrs without an attempt. Mom to offer both breasts, STS, with breast compression to increase milk transfer. Mom to call RN or LC for assistance to apply 20 NS and prime with EBM before latching.           2. Mom to paced bottle feed EBM first, formula according to breastfeeding supplementation guideline and hrs of age since birth.           3. Pump using DEBP as stated above.   All questions answered at the end of the visit.   Maternal Data    Feeding Mother's Current Feeding Choice: Breast Milk and Formula  LATCH Score Latch: Repeated attempts needed to sustain latch, nipple held in mouth throughout feeding, stimulation needed to elicit sucking reflex.  Audible Swallowing: A few with stimulation  Type of Nipple: Inverted  Comfort (Breast/Nipple): Soft / non-tender  Hold (Positioning): Assistance needed to correctly position infant at  breast and maintain latch.  LATCH Score: 5   Lactation Tools Discussed/Used Tools: Nipple Shields;Pump;Flanges Nipple shield size: 20 Flange Size: 24 Breast pump type: Double-Electric Breast Pump Pump Education: Setup, frequency, and cleaning;Milk Storage Reason for Pumping: increase stimulation Pumping frequency: every 3 hrs for 15 minutes  Interventions Interventions: Breast feeding basics reviewed;Support pillows;Education;Assisted with latch;Position options;Skin to skin;Expressed milk;Breast massage;Hand express;DEBP;Breast compression;Adjust position  Discharge    Consult Status Consult Status: Follow-up Date: 05/10/20 Follow-up type: In-patient    Jamie Moffa  [name] 05/09/2020, 4:53 PM

## 2020-05-09 NOTE — Lactation Note (Signed)
This note was copied from a baby's chart. [facility] student attempted to visit patients; baby has left for circumcision. RN is working with mom and we will revisit at a later time today.

## 2020-05-09 NOTE — Social Work (Signed)
CSW verbally consulted to provide MOB with information on [facility] and Medicaid. CSW contacted the Financial Navigator Teena Dunk to make referral for questions regarding Medicaid.   CSW met with MOB to provide information on [facility]. CSW introduced self and role. CSW observed infant sleeping in bassinet. CSW provided MOB with information to apply for [facility] via telephone. CSW assessed for additional needs. MOB reported she is doing okay and has no additional needs at this time.   CSW identifies no further need for intervention and no barriers to discharge at this time.  Darra Lis, [place] [employer] [phone]

## 2020-05-09 NOTE — Lactation Note (Signed)
This note was copied from a baby's chart. Lactation Consultation Note  Patient Name: Jamie [name] [id number] Date: 05/09/2020   Age:35 hours  LC went in to see If Mom needed assistance with latching her infant. Mom on the phone and asked for me to come back later.  Maternal Data    Feeding    LATCH Score                    Lactation Tools Discussed/Used    Interventions    Discharge    Consult Status      Jamie Colville  [name] 05/09/2020, 3:49 PM

## 2020-05-10 MED ORDER — IBUPROFEN 600 MG PO TABS: 600.0000 mg | ORAL_TABLET | Freq: Four times a day (QID) | ORAL | 0 refills | Status: AC | PRN

## 2020-05-10 NOTE — Lactation Note (Signed)
This note was copied from a baby's chart. Lactation Consultation Note  Patient Name: Jamie [name] [id number] Date: 05/10/2020 Reason for consult: Follow-up assessment Age:35 hours   P3, Baby latched upon entering with #20NS.  During feeding baby came off. Assisted with latching without nipple shield.  Encouraged head support for depth. Encouraged mother to offer breast before formula to help establish her milk supply.  Mother denies questions or concerns.   Feeding Mother's Current Feeding Choice: Breast Milk and Formula Nipple Type: Slow - flow  LATCH Score Latch: Grasps breast easily, tongue down, lips flanged, rhythmical sucking.  Audible Swallowing: A few with stimulation  Type of Nipple: Everted at rest and after stimulation  Comfort (Breast/Nipple): Soft / non-tender  Hold (Positioning): No assistance needed to correctly position infant at breast.  LATCH Score: 9   Lactation Tools Discussed/Used Tools: Pump Nipple shield size: 20 (Simultaneous filing. User may not have seen previous data.) Breast pump type: Double-Electric Breast Pump;Manual Pumping frequency: random (encouraged q 3 hours)  Interventions Interventions: Breast feeding basics reviewed (shield)  Discharge Discharge Education: Engorgement and breast care;Warning signs for feeding baby Pump: DEBP  Consult Status Consult Status: Complete Date: 05/10/20    Dahlia Byes [facility] 05/10/2020, 9:23 AM

## 2020-05-10 NOTE — Discharge Summary (Signed)
Postpartum Discharge Summary  Date of Service updated 05/10/20     Patient Name: Jamie [name] DOB: 29-Mar-1985 MRN: [id number]  Date of admission: 05/08/2020 Delivery date:05/08/2020  Delivering provider: Philip Aspen  Date of discharge: 05/10/2020  Admitting diagnosis: Pregnancy affected by fetal growth restriction [O36.5990] Intrauterine pregnancy: [redacted]w[redacted]d     Secondary diagnosis:  Active Problems:   Pregnancy affected by fetal growth restriction  Additional problems: None    Discharge diagnosis: Term Pregnancy Delivered                                              Post partum procedures:None Augmentation: AROM, Pitocin and Cytotec Complications: None  Hospital course: Induction of Labor With Vaginal Delivery   35 y.o. yo [id number] at [redacted]w[redacted]d was admitted to the hospital 05/08/2020 for induction of labor.  Indication for induction: fetal growth restriction.  Patient had an uncomplicated labor course as follows: Membrane Rupture Time/Date: 2:06 PM ,05/08/2020   Delivery Method:Vaginal, Spontaneous  Episiotomy: None  Lacerations:  None  Details of delivery can be found in separate delivery note.  Patient had a routine postpartum course. Patient is discharged home 05/10/20.  Newborn Data: Birth date:05/08/2020  Birth time:2:33 PM  Gender:Female  Living status:Living  Apgars:8 ,9  Weight:3005 g   Physical exam  Vitals:   05/09/20 0630 05/09/20 1352 05/09/20 2326 05/10/20 0500  BP: 98/62 107/72 110/74 105/72  Pulse: (!) 57 73 69 70  Resp: 16 18 18 16   Temp: 98.2 F (36.8 C) 98.3 F (36.8 C) 98.1 F (36.7 C) 98.2 F (36.8 C)  TempSrc: Oral Oral Oral Oral  SpO2:  99% 100%   Weight:      Height:       General: alert, cooperative and no distress Lochia: appropriate Uterine Fundus: firm DVT Evaluation: No evidence of DVT seen on physical exam. Labs: Lab Results  Component Value Date   WBC 10.3 05/09/2020   HGB 12.0 05/09/2020   HCT 36.5 05/09/2020   MCV 93.1 05/09/2020    PLT 133 (L) 05/09/2020   No flowsheet data found. Edinburgh Score: Edinburgh Postnatal Depression Scale Screening Tool 06/03/2017  I have been able to laugh and see the funny side of things. 0  I have looked forward with enjoyment to things. 0  I have blamed myself unnecessarily when things went wrong. 3  I have been anxious or worried for no good reason. 0  I have felt scared or panicky for no good reason. 0  Things have been getting on top of me. 0  I have been so unhappy that I have had difficulty sleeping. 0  I have felt sad or miserable. 0  I have been so unhappy that I have been crying. 0  The thought of harming myself has occurred to me. 0  Edinburgh Postnatal Depression Scale Total 3      After visit meds:  Allergies as of 05/10/2020   No Known Allergies     Medication List    TAKE these medications   ibuprofen 600 MG tablet Commonly known as: ADVIL Take 1 tablet (600 mg total) by mouth every 6 (six) hours as needed for moderate pain or cramping.   prenatal multivitamin Tabs tablet Take 1 tablet by mouth daily at 12 noon.        Discharge home in stable condition Infant Feeding:  Breast Infant Disposition:home with mother Discharge instruction: per After Visit Summary and Postpartum booklet. Activity: Advance as tolerated. Pelvic rest for 6 weeks.  Diet: routine diet Postpartum Appointment:4 weeks Future Appointments:No future appointments. Follow up Visit:  Follow-up Information    Philip Aspen, DO Follow up in 4 week(s).   Specialty: Obstetrics and Gynecology Contact information: [address] [place] [address] [phone]                   05/10/2020 [facility] GEFFEL Chestine Spore, MD

## 2021-07-05 ENCOUNTER — Ambulatory Visit
Admission: RE | Admit: 2021-07-05 | Discharge: 2021-07-05 | Disposition: A | Discharge status: Home / Self Care | Payer: BC Managed Care – PPO | Source: Ambulatory Visit | Attending: Physician Assistant | Admitting: Physician Assistant

## 2021-07-05 ENCOUNTER — Other Ambulatory Visit: Payer: Self-pay | Admitting: Physician Assistant

## 2021-07-05 DIAGNOSIS — M25561 Pain in right knee: Secondary | ICD-10-CM

## 2021-07-12 ENCOUNTER — Encounter: Payer: Self-pay | Admitting: Physical Therapy

## 2021-07-12 ENCOUNTER — Ambulatory Visit: Payer: BC Managed Care – PPO | Attending: Sports Medicine | Admitting: Physical Therapy

## 2021-07-12 ENCOUNTER — Other Ambulatory Visit: Payer: Self-pay

## 2021-07-12 DIAGNOSIS — G8929 Other chronic pain: Secondary | ICD-10-CM | POA: Diagnosis present

## 2021-07-12 DIAGNOSIS — M25561 Pain in right knee: Secondary | ICD-10-CM | POA: Insufficient documentation

## 2021-07-12 DIAGNOSIS — M6281 Muscle weakness (generalized): Secondary | ICD-10-CM | POA: Insufficient documentation

## 2021-07-12 NOTE — Therapy (Signed)
OUTPATIENT PHYSICAL THERAPY LOWER EXTREMITY EVALUATION   Patient Name: Jamie [name] MRN: [id number] DOB:09/04/1985, 36 y.o., female Today's Date: 07/12/2021   PT End of Session - 07/12/21 0935     Visit Number 1    Date for PT Re-Evaluation 08/23/21    Authorization Type BCBS    PT Start Time 0930    PT Stop Time 1010    PT Time Calculation (min) 40 min    Activity Tolerance Patient tolerated treatment well    Behavior During Therapy WFL for tasks assessed/performed             Past Medical History:  Diagnosis Date   GERD (gastroesophageal reflux disease)    Medical history non-contributory    Past Surgical History:  Procedure Laterality Date   DILATION AND EVACUATION N/A 06/08/2019   Procedure: DILATATION AND EVACUATION;  Surgeon: Marlow Baars, MD;  Location: [facility];  Service: Gynecology;  Laterality: N/A;   NO PAST SURGERIES     Patient Active Problem List   Diagnosis Date Noted   Pregnancy affected by fetal growth restriction 05/08/2020   Normal labor 06/01/2017    REFERRING PROVIDER: Christena Deem, MD   REFERRING DIAG: [phone] (ICD-10-CM) - Pain in right knee   THERAPY DIAG:  Chronic pain of right knee  Muscle weakness (generalized)  Rationale for Evaluation and Treatment Rehabilitation  ONSET DATE: one month ago, insidious onset  SUBJECTIVE:   SUBJECTIVE STATEMENT: Pt referred to OPPT for Rt knee pain.  Pt is accompanied by an interpreter.  Insidious onset.  Worse after sitting at desk or when standing to cook.   PERTINENT HISTORY: LBP, has two young children  PAIN:  PAIN:  Are you having pain? Yes NPRS scale: 6-7/10 Pain location: Rt knee Pain orientation: Right, medial and lateral, deep PAIN TYPE: aching and deep Pain description: constant  Aggravating factors: prolonged sitting Relieving factors: just started Rx yesterday and maybe is helping, bandaging knee   PRECAUTIONS: None  WEIGHT BEARING RESTRICTIONS  No  FALLS:  Has patient fallen in last 6 months? No  LIVING ENVIRONMENT: Lives with: lives with their family Lives in: House/apartment Stairs: No Has following equipment at home: None  OCCUPATION: desk job, full time  PLOF: Independent  PATIENT GOALS feel better   OBJECTIVE:   DIAGNOSTIC FINDINGS: Trace early patellofemoral spurring   PATIENT SURVEYS:  FOTO 80%, goal 88%  COGNITION:  Overall cognitive status: Within functional limits for tasks assessed     SENSATION: WFL  EDEMA:  Mild infrapatellar edema present  MUSCLE LENGTH: Hamstrings: Right 70 deg; Left 70 deg Rt ITB tight Bil gluteals and piriformis tight  POSTURE: No Significant postural limitations  PALPATION: Rt knee: Medial and lateral joint lines Distal ITB Distal lateral hamstring tendon  LOWER EXTREMITY ROM:  Active/Passive ROM Right eval Left eval  Hip flexion    Hip extension    Hip abduction    Hip adduction    Hip internal rotation    Hip external rotation    Knee flexion AROM 122 PROM 130, pain  134 AROM  Knee extension 0 0  Ankle dorsiflexion    Ankle plantarflexion    Ankle inversion    Ankle eversion     (Blank rows = not tested)  LOWER EXTREMITY MMT:  MMT Right eval Left eval  Hip flexion Unable to perform SLR, 3/5   Hip extension    Hip abduction 3+ 3+  Hip adduction    Hip internal rotation  Hip external rotation    Knee flexion 4 4  Knee extension 4+ 4  Ankle dorsiflexion    Ankle plantarflexion    Ankle inversion    Ankle eversion     (Blank rows = not tested)  LOWER EXTREMITY SPECIAL TESTS:  Knee special tests: Patellafemoral grind test: negative  FUNCTIONAL TESTS:  Sit to stand with ease  GAIT: Distance walked: clinic Assistive device utilized: None Level of assistance: Complete Independence Comments: no gait abnormality noted    TODAY'S TREATMENT: Initiated HEP   PATIENT EDUCATION:  Education details: Access Code: [email or web address] Person  educated: Patient Education method: Programmer, multimedia, Facilities manager, Verbal cues, and Handouts Education comprehension: verbalized understanding and returned demonstration   HOME EXERCISE PROGRAM: Access Code: [email or web address] URL: [email or web address] Date: 07/12/2021 Prepared by: Loistine Simas Ravinder Hofland  Exercises - Supine Short Arc Quad  - 1 x daily - 7 x weekly - 2 sets - 20 reps - Clamshell with Resistance  - 1 x daily - 7 x weekly - 2 sets - 10 reps - Supine Hamstring Stretch with Strap  - 1 x daily - 7 x weekly - 1 sets - 3 reps - 30 hold - Supine ITB Stretch  - 1 x daily - 7 x weekly - 1 sets - 3 reps - 30 hold - Standing Gastroc Stretch at Counter  - 1 x daily - 7 x weekly - 1 sets - 3 reps - 30 hold  ASSESSMENT:  CLINICAL IMPRESSION: Patient is a 36 y.o. female who was seen today for physical therapy evaluation and treatment for Rt knee pain. She has limited A/ROM (full P/ROM with pain), mild edema, and weakness of core, Rt hip and thigh, and limited flexibility of ITB, hamstring, gluteals and piriformis.  Tenderness is present at distal ITB, lateral hamstring tendon, and medial/lateral joint lines.  Interpreter present to assist with evaluation today.    OBJECTIVE IMPAIRMENTS decreased ROM, decreased strength, hypomobility, increased edema, impaired flexibility, and pain.   ACTIVITY LIMITATIONS sitting  PARTICIPATION LIMITATIONS: meal prep and occupation  PERSONAL FACTORS 1 comorbidity: LBP and proximal hip weakness  are also affecting patient's functional outcome.   REHAB POTENTIAL: Good  CLINICAL DECISION MAKING: Stable/uncomplicated  EVALUATION COMPLEXITY: Low   GOALS: Goals reviewed with patient? Yes  SHORT TERM GOALS: Target date: 08/02/21 Pt will be ind with initial HEP without exacerbation of symptoms Baseline: Goal status: INITIAL  2.  Pt will report at least 25% reduction in pain with sitting and meal prep. Baseline:  Goal status: INITIAL  3.  Pt will  be able to perform at least 10 SLR on Rt Baseline: unable to perform 1 rep Goal status: INITIAL  4.  Pt will achieve Rt knee A/ROM at least 127 deg Baseline: 122 Goal status: INITIAL   LONG TERM GOALS: Target date: 08/23/2021   Pt will be ind with advanced HEP and understand how to safely progress. Baseline:  Goal status: INITIAL  2.  Improve FOTO to at least 88% to demo improved function.   Baseline: 80% Goal status: INITIAL  3.  Pt will achieve Rt knee A/ROM to at least 130 deg to improve sitting, gait and squatting. Baseline:  Goal status: INITIAL  4.  Pt will report at least 75% reduction in pain with sitting at work and standing to cook meals.  Baseline:  Goal status: INITIAL  5.  Pt will achieve Rt LE flexility to WNL to reduce undue stress across hip and knee joint. Baseline:  Goal status: INITIAL  6.  Pt will achieve at least 4+/5 strength of Rt hip and knee to provide better support to knee joint. Baseline:  Goal status: INITIAL   PLAN: PT FREQUENCY: 2x/week  PT DURATION: 6 weeks  PLANNED INTERVENTIONS: Therapeutic exercises, Therapeutic activity, Neuromuscular re-education, Balance training, Gait training, Patient/Family education, Joint mobilization, Dry Needling, Electrical stimulation, Cryotherapy, Moist heat, Taping, Vasopneumatic device, and Manual therapy  PLAN FOR NEXT SESSION: review HEP and progress knee ROM, flexibility and Rt LE strength as tol   Lalita Ebel, PT 07/12/21 1:33 PM

## 2021-07-24 ENCOUNTER — Encounter: Payer: Self-pay | Admitting: Physical Therapy

## 2021-07-24 ENCOUNTER — Ambulatory Visit: Payer: BC Managed Care – PPO | Attending: Sports Medicine

## 2021-07-24 DIAGNOSIS — G8929 Other chronic pain: Secondary | ICD-10-CM | POA: Diagnosis present

## 2021-07-24 DIAGNOSIS — M6281 Muscle weakness (generalized): Secondary | ICD-10-CM | POA: Insufficient documentation

## 2021-07-24 DIAGNOSIS — M25561 Pain in right knee: Secondary | ICD-10-CM | POA: Diagnosis not present

## 2021-07-24 NOTE — Therapy (Signed)
OUTPATIENT PHYSICAL THERAPY TREATMENT   Patient Name: Jamie [name] MRN: [id number] DOB:04-Aug-1985, 36 y.o., female Today's Date: 07/24/2021   PT End of Session - 07/24/21 1147     Visit Number 2    Date for PT Re-Evaluation 08/23/21    Authorization Type BCBS    PT Start Time 1108    PT Stop Time 1145    PT Time Calculation (min) 37 min    Activity Tolerance Patient tolerated treatment well    Behavior During Therapy WFL for tasks assessed/performed              Past Medical History:  Diagnosis Date   GERD (gastroesophageal reflux disease)    Medical history non-contributory    Past Surgical History:  Procedure Laterality Date   DILATION AND EVACUATION N/A 06/08/2019   Procedure: DILATATION AND EVACUATION;  Surgeon: Marlow Baars, MD;  Location: [facility] [place];  Service: Gynecology;  Laterality: N/A;   NO PAST SURGERIES     Patient Active Problem List   Diagnosis Date Noted   Pregnancy affected by fetal growth restriction 05/08/2020   Normal labor 06/01/2017    REFERRING PROVIDER: Christena Deem, MD   REFERRING DIAG: [phone] (ICD-10-CM) - Pain in right knee   THERAPY DIAG:  Chronic pain of right knee  Muscle weakness (generalized)  Rationale for Evaluation and Treatment Rehabilitation  ONSET DATE: one month ago, insidious onset  SUBJECTIVE:   SUBJECTIVE STATEMENT: I'ver been doing the exercises and they don't make things worse.  Knee pain is about the same.    PERTINENT HISTORY: LBP, has two young children  PAIN:  PAIN:  Are you having pain? Yes NPRS scale: 6-7/10 Pain location: Rt knee Pain orientation: Right, medial and lateral, deep PAIN TYPE: aching and deep Pain description: constant  Aggravating factors: prolonged sitting Relieving factors: just started Rx yesterday and maybe is helping, bandaging knee   PRECAUTIONS: None  WEIGHT BEARING RESTRICTIONS No  FALLS:  Has patient fallen in last 6 months? No  LIVING  ENVIRONMENT: Lives with: lives with their family Lives in: House/apartment Stairs: No Has following equipment at home: None  OCCUPATION: desk job, full time  PLOF: Independent  PATIENT GOALS feel better   OBJECTIVE:   DIAGNOSTIC FINDINGS: Trace early patellofemoral spurring   PATIENT SURVEYS:  FOTO 80%, goal 88%  COGNITION:  Overall cognitive status: Within functional limits for tasks assessed     SENSATION: WFL  EDEMA:  Mild infrapatellar edema present  MUSCLE LENGTH: Hamstrings: Right 70 deg; Left 70 deg Rt ITB tight Bil gluteals and piriformis tight  POSTURE: No Significant postural limitations  PALPATION: Rt knee: Medial and lateral joint lines Distal ITB Distal lateral hamstring tendon  LOWER EXTREMITY ROM:  Active/Passive ROM Right eval Left eval  Hip flexion    Hip extension    Hip abduction    Hip adduction    Hip internal rotation    Hip external rotation    Knee flexion AROM 122 PROM 130, pain  134 AROM  Knee extension 0 0  Ankle dorsiflexion    Ankle plantarflexion    Ankle inversion    Ankle eversion     (Blank rows = not tested)  LOWER EXTREMITY MMT:  MMT Right eval Left eval  Hip flexion Unable to perform SLR, 3/5   Hip extension    Hip abduction 3+ 3+  Hip adduction    Hip internal rotation    Hip external rotation    Knee flexion 4  4  Knee extension 4+ 4  Ankle dorsiflexion    Ankle plantarflexion    Ankle inversion    Ankle eversion     (Blank rows = not tested)  LOWER EXTREMITY SPECIAL TESTS:  Knee special tests: Patellafemoral grind test: negative  FUNCTIONAL TESTS:  Sit to stand with ease  GAIT: Distance walked: clinic Assistive device utilized: None Level of assistance: Complete Independence Comments: no gait abnormality noted    TODAY'S TREATMENT: Date: 07/24/21 NuStep: Level 2x 6 minutes-PT present to discuss progress  Seated hamstring stretch: 3x20"  Supine short arc quads x10 ITB stretch in  supine: 2x30 seconds  Sidelyling clams with yellow band: tactile cues for alignment and speed Standing gastroc stretch: 3x20 seconds  Manual: Addaday: Rt gluteals and distal lateral hamstring    07/12/21: Initiated HEP   PATIENT EDUCATION:  Education details: Access Code: [email or web address] Person educated: Patient Education method: Programmer, multimedia, Facilities manager, Verbal cues, and Handouts Education comprehension: verbalized understanding and returned demonstration   HOME EXERCISE PROGRAM: Access Code: [email or web address] URL: [email or web address] Date: 07/12/2021 Prepared by: Loistine Simas Beuhring  Exercises - Supine Short Arc Quad  - 1 x daily - 7 x weekly - 2 sets - 20 reps - Clamshell with Resistance  - 1 x daily - 7 x weekly - 2 sets - 10 reps - Supine Hamstring Stretch with Strap  - 1 x daily - 7 x weekly - 1 sets - 3 reps - 30 hold - Supine ITB Stretch  - 1 x daily - 7 x weekly - 1 sets - 3 reps - 30 hold - Standing Gastroc Stretch at Counter  - 1 x daily - 7 x weekly - 1 sets - 3 reps - 30 hold  ASSESSMENT:  CLINICAL IMPRESSION: First time follow-up after evaluation today.  Pt reports compliance with HEP and we reviewed the exercises today.  Pt required tactile cues and verbal cues for alignment and speed with sidelying clams.  No increased pain with exercises and pt was able to complete all exercises without difficulty. Interpreter present to assist with evaluation today. Patient will benefit from skilled PT to address the below impairments and improve overall function.    OBJECTIVE IMPAIRMENTS decreased ROM, decreased strength, hypomobility, increased edema, impaired flexibility, and pain.   ACTIVITY LIMITATIONS sitting  PARTICIPATION LIMITATIONS: meal prep and occupation  PERSONAL FACTORS 1 comorbidity: LBP and proximal hip weakness  are also affecting patient's functional outcome.   REHAB POTENTIAL: Good  CLINICAL DECISION MAKING: Stable/uncomplicated  EVALUATION  COMPLEXITY: Low   GOALS: Goals reviewed with patient? Yes  SHORT TERM GOALS: Target date: 08/02/21 Pt will be ind with initial HEP without exacerbation of symptoms Baseline: Goal status: INITIAL  2.  Pt will report at least 25% reduction in pain with sitting and meal prep. Baseline:  Goal status: INITIAL  3.  Pt will be able to perform at least 10 SLR on Rt Baseline: unable to perform 1 rep Goal status: INITIAL  4.  Pt will achieve Rt knee A/ROM at least 127 deg Baseline: 122 Goal status: INITIAL   LONG TERM GOALS: Target date: 09/04/2021   Pt will be ind with advanced HEP and understand how to safely progress. Baseline:  Goal status: INITIAL  2.  Improve FOTO to at least 88% to demo improved function.   Baseline: 80% Goal status: INITIAL  3.  Pt will achieve Rt knee A/ROM to at least 130 deg to improve sitting, gait and squatting. Baseline:  Goal status: INITIAL  4.  Pt will report at least 75% reduction in pain with sitting at work and standing to cook meals.  Baseline:  Goal status: INITIAL  5.  Pt will achieve Rt LE flexility to WNL to reduce undue stress across hip and knee joint. Baseline:  Goal status: INITIAL  6.  Pt will achieve at least 4+/5 strength of Rt hip and knee to provide better support to knee joint. Baseline:  Goal status: INITIAL   PLAN: PT FREQUENCY: 2x/week  PT DURATION: 6 weeks  PLANNED INTERVENTIONS: Therapeutic exercises, Therapeutic activity, Neuromuscular re-education, Balance training, Gait training, Patient/Family education, Joint mobilization, Dry Needling, Electrical stimulation, Cryotherapy, Moist heat, Taping, Vasopneumatic device, and Manual therapy  PLAN FOR NEXT SESSION:  progress knee ROM, flexibility and Rt LE strength as Ace Gins, PT 07/24/21 11:48 AM   [facility] [address] [place], [place] [address] Phone # [phone] Fax [phone]

## 2021-07-27 ENCOUNTER — Ambulatory Visit: Payer: BC Managed Care – PPO | Admitting: Physical Therapy

## 2021-07-27 ENCOUNTER — Encounter: Payer: Self-pay | Admitting: Physical Therapy

## 2021-07-27 DIAGNOSIS — M25561 Pain in right knee: Secondary | ICD-10-CM | POA: Diagnosis not present

## 2021-07-27 DIAGNOSIS — M6281 Muscle weakness (generalized): Secondary | ICD-10-CM

## 2021-07-27 DIAGNOSIS — G8929 Other chronic pain: Secondary | ICD-10-CM

## 2021-07-27 NOTE — Therapy (Signed)
OUTPATIENT PHYSICAL THERAPY TREATMENT   Patient Name: Jamie [name] MRN: [id number] DOB:[birth year], 36 y.o., female Today's Date: 07/27/2021   PT End of Session - 07/27/21 0929     Visit Number 3    Date for PT Re-Evaluation 08/23/21    Authorization Type BCBS    PT Start Time 0929    PT Stop Time 1011    PT Time Calculation (min) 42 min    Activity Tolerance Patient tolerated treatment well    Behavior During Therapy WFL for tasks assessed/performed               Past Medical History:  Diagnosis Date   GERD (gastroesophageal reflux disease)    Medical history non-contributory    Past Surgical History:  Procedure Laterality Date   DILATION AND EVACUATION N/A 06/08/2019   Procedure: DILATATION AND EVACUATION;  Surgeon: Marlow Baars, MD;  Location: [facility];  Service: Gynecology;  Laterality: N/A;   NO PAST SURGERIES     Patient Active Problem List   Diagnosis Date Noted   Pregnancy affected by fetal growth restriction 05/08/2020   Normal labor 06/01/2017    REFERRING PROVIDER: Christena Deem, MD   REFERRING DIAG: [phone] (ICD-10-CM) - Pain in right knee   THERAPY DIAG:  Chronic pain of right knee  Muscle weakness (generalized)  Rationale for Evaluation and Treatment Rehabilitation  ONSET DATE: one month ago, insidious onset  SUBJECTIVE:   SUBJECTIVE STATEMENT: I did good after last session. Pain more intermittent now.   PERTINENT HISTORY: LBP, has two young children  PAIN:  PAIN:  Are you having pain? No not right now NPRS scale: /10 Pain location: Rt knee Pain orientation: Right, medial and lateral, deep PAIN TYPE: aching and deep Pain description:   Aggravating factors: prolonged sitting Relieving factors: just started Rx yesterday and maybe is helping, bandaging knee   PRECAUTIONS: None  WEIGHT BEARING RESTRICTIONS No  FALLS:  Has patient fallen in last 6 months? No  LIVING ENVIRONMENT: Lives with: lives with their  family Lives in: House/apartment Stairs: No Has following equipment at home: None  OCCUPATION: desk job, full time  PLOF: Independent  PATIENT GOALS feel better   OBJECTIVE:   DIAGNOSTIC FINDINGS: Trace early patellofemoral spurring   PATIENT SURVEYS:  FOTO 80%, goal 88%  COGNITION:  Overall cognitive status: Within functional limits for tasks assessed     SENSATION: WFL  EDEMA:  Mild infrapatellar edema present  MUSCLE LENGTH: Hamstrings: Right 70 deg; Left 70 deg Rt ITB tight Bil gluteals and piriformis tight  POSTURE: No Significant postural limitations  PALPATION: Rt knee: Medial and lateral joint lines Distal ITB Distal lateral hamstring tendon  LOWER EXTREMITY ROM:  Active/Passive ROM Right eval Left eval  Hip flexion    Hip extension    Hip abduction    Hip adduction    Hip internal rotation    Hip external rotation    Knee flexion AROM 122 PROM 130, pain  134 AROM  Knee extension 0 0  Ankle dorsiflexion    Ankle plantarflexion    Ankle inversion    Ankle eversion     (Blank rows = not tested)  LOWER EXTREMITY MMT:  MMT Right eval Left eval  Hip flexion Unable to perform SLR, 3/5   Hip extension    Hip abduction 3+ 3+  Hip adduction    Hip internal rotation    Hip external rotation    Knee flexion 4 4  Knee extension 4+  4  Ankle dorsiflexion    Ankle plantarflexion    Ankle inversion    Ankle eversion     (Blank rows = not tested)  LOWER EXTREMITY SPECIAL TESTS:  Knee special tests: Patellafemoral grind test: negative  FUNCTIONAL TESTS:  Sit to stand with ease  GAIT: Distance walked: clinic Assistive device utilized: None Level of assistance: Complete Independence Comments: no gait abnormality noted    TODAY'S TREATMENT:  07/27/21: NuStep: Level 3x 6 minutes-PTA  present to discuss progress  Seated hamstring stretch: 3x20"  Supine short arc quads x10 ITB stretch in supine: 2x30 seconds  Sidelyling clams with red  band: tactile cues for alignment and speed Sidelying in 90/90: isometric lift to contract abductors & hip flex hold 5 sec 2x6 Supine bridge 5 sec hold 6x Supine RTLE SLR with core contraction 6x Standing gastroc stretch: 3x20 seconds  Manual: Addaday: Rt gluteals and distal lateral hamstring   Date: 07/24/21 NuStep: Level 2x 6 minutes-PT present to discuss progress  Seated hamstring stretch: 3x20"  Supine short arc quads x10 ITB stretch in supine: 2x30 seconds  Sidelyling clams with yellow band: tactile cues for alignment and speed Standing gastroc stretch: 3x20 seconds  Manual: Addaday: Rt gluteals and distal lateral hamstring    07/12/21: Initiated HEP   PATIENT EDUCATION:  Education details: Access Code: [email or web address] Person educated: Patient Education method: Programmer, multimedia, Facilities manager, Verbal cues, and Handouts Education comprehension: verbalized understanding and returned demonstration   HOME EXERCISE PROGRAM: Access Code: [email or web address] URL: [email or web address] Date: 07/12/2021 Prepared by: Loistine Simas Beuhring  Exercises - Supine Short Arc Quad  - 1 x daily - 7 x weekly - 2 sets - 20 reps - Clamshell with Resistance  - 1 x daily - 7 x weekly - 2 sets - 10 reps - Supine Hamstring Stretch with Strap  - 1 x daily - 7 x weekly - 1 sets - 3 reps - 30 hold - Supine ITB Stretch  - 1 x daily - 7 x weekly - 1 sets - 3 reps - 30 hold - Standing Gastroc Stretch at Counter  - 1 x daily - 7 x weekly - 1 sets - 3 reps - 30 hold Access Code: [email or web address] URL: [email or web address] Date: 07/27/2021 Prepared by: [employer]  - Supine Active Straight Leg Raise  - 1 x daily - 7 x weekly - 10 reps - Supine Hamstring Stretch with Strap  - 1 x daily - 7 x weekly - 2 sets - 3 reps - Supine Bridge  - 1 x daily - 7 x weekly - 10 reps  ASSESSMENT:  CLINICAL IMPRESSION: Pt arrives to PT this AM with no pain. Pt reports pain is becoming less constant and more come & go.  Pt demonstrates Rt hip weakness in todays exercises, gravity being quite challenging but not painful. HEP was added to today.   OBJECTIVE IMPAIRMENTS decreased ROM, decreased strength, hypomobility, increased edema, impaired flexibility, and pain.   ACTIVITY LIMITATIONS sitting  PARTICIPATION LIMITATIONS: meal prep and occupation  PERSONAL FACTORS 1 comorbidity: LBP and proximal hip weakness  are also affecting patient's functional outcome.   REHAB POTENTIAL: Good  CLINICAL DECISION MAKING: Stable/uncomplicated  EVALUATION COMPLEXITY: Low   GOALS: Goals reviewed with patient? Yes  SHORT TERM GOALS: Target date: 08/02/21 Pt will be ind with initial HEP without exacerbation of symptoms Baseline: Goal status: INITIAL  2.  Pt will report at least 25% reduction in pain with sitting and meal prep. Baseline:  Goal status: INITIAL  3.  Pt will be able to perform at least 10 SLR on Rt Baseline: unable to perform 1 rep Goal status: On Going: completed 6x today  4.  Pt will achieve Rt knee A/ROM at least 127 deg Baseline: 122 Goal status: INITIAL   LONG TERM GOALS: Target date: 09/07/2021   Pt will be ind with advanced HEP and understand how to safely progress. Baseline:  Goal status: INITIAL  2.  Improve FOTO to at least 88% to demo improved function.   Baseline: 80% Goal status: INITIAL  3.  Pt will achieve Rt knee A/ROM to at least 130 deg to improve sitting, gait and squatting. Baseline:  Goal status: INITIAL  4.  Pt will report at least 75% reduction in pain with sitting at work and standing to cook meals.  Baseline:  Goal status: INITIAL  5.  Pt will achieve Rt LE flexility to WNL to reduce undue stress across hip and knee joint. Baseline:  Goal status: INITIAL  6.  Pt will achieve at least 4+/5 strength of Rt hip and knee to provide better support to knee joint. Baseline:  Goal status: INITIAL   PLAN: PT FREQUENCY: 2x/week  PT DURATION: 6 weeks  PLANNED  INTERVENTIONS: Therapeutic exercises, Therapeutic activity, Neuromuscular re-education, Balance training, Gait training, Patient/Family education, Joint mobilization, Dry Needling, Electrical stimulation, Cryotherapy, Moist heat, Taping, Vasopneumatic device, and Manual therapy  PLAN FOR NEXT SESSION:  progress knee ROM, flexibility and Rt LE strength as tol   Ane Payment, PTA 07/27/21 10:12 AM   [facility] [address], [address] [place], [place] [address] Phone # [phone] Fax [phone]

## 2021-07-31 ENCOUNTER — Encounter: Payer: Self-pay | Admitting: Physical Therapy

## 2021-07-31 ENCOUNTER — Ambulatory Visit: Payer: BC Managed Care – PPO | Admitting: Physical Therapy

## 2021-07-31 DIAGNOSIS — M6281 Muscle weakness (generalized): Secondary | ICD-10-CM

## 2021-07-31 DIAGNOSIS — M25561 Pain in right knee: Secondary | ICD-10-CM | POA: Diagnosis not present

## 2021-07-31 DIAGNOSIS — G8929 Other chronic pain: Secondary | ICD-10-CM

## 2021-07-31 NOTE — Therapy (Signed)
OUTPATIENT PHYSICAL THERAPY TREATMENT NOTE   Patient Name: Jamie [name] MRN: [id number] DOB:1985-08-02, 36 y.o., female Today's Date: 07/31/2021   END OF SESSION:   PT End of Session - 07/31/21 1537     Visit Number 4    Date for PT Re-Evaluation 08/23/21    Authorization Type BCBS    PT Start Time 1533    PT Stop Time 1613    PT Time Calculation (min) 40 min    Activity Tolerance Patient tolerated treatment well;No increased pain    Behavior During Therapy WFL for tasks assessed/performed             Past Medical History:  Diagnosis Date   GERD (gastroesophageal reflux disease)    Medical history non-contributory    Past Surgical History:  Procedure Laterality Date   DILATION AND EVACUATION N/A 06/08/2019   Procedure: DILATATION AND EVACUATION;  Surgeon: Jerelyn Charles, MD;  Location: [place];  Service: Gynecology;  Laterality: N/A;   NO PAST SURGERIES     Patient Active Problem List   Diagnosis Date Noted   Pregnancy affected by fetal growth restriction 05/08/2020   Normal labor 06/01/2017       REFERRING DIAG: M25.561 (ICD-10-CM) - Pain in right knee    THERAPY DIAG:  Chronic pain of right knee   Muscle weakness (generalized)   Rationale for Evaluation and Treatment Rehabilitation   ONSET DATE: one month ago, insidious onset   SUBJECTIVE:    SUBJECTIVE STATEMENT: Pt states no pain today. She does good during her sessions and usually has no pain for about 1 day following PT then her pain will return intermittently.    PERTINENT HISTORY: LBP, has two young children   PAIN:  PAIN:  Are you having pain? No not right now NPRS scale: 0/10 Pain location: Rt knee Pain orientation: Right, medial and lateral, deep PAIN TYPE: aching and deep Pain description:   Aggravating factors: prolonged sitting Relieving factors: just started Rx yesterday and maybe is helping, bandaging knee     PRECAUTIONS: None   WEIGHT BEARING RESTRICTIONS No    FALLS:  Has patient fallen in last 6 months? No   LIVING ENVIRONMENT: Lives with: lives with their family Lives in: House/apartment Stairs: No Has following equipment at home: None   OCCUPATION: desk job, full time   PLOF: Independent   PATIENT GOALS feel better      OBJECTIVE: (objective measures completed at initial evaluation unless otherwise dated)   DIAGNOSTIC FINDINGS: Trace early patellofemoral spurring    PATIENT SURVEYS:  FOTO 80%, goal 88%   COGNITION:           Overall cognitive status: Within functional limits for tasks assessed                          SENSATION: WFL   EDEMA:  Mild infrapatellar edema present   MUSCLE LENGTH: Hamstrings: Right 70 deg; Left 70 deg Rt ITB tight Bil gluteals and piriformis tight   POSTURE: No Significant postural limitations   PALPATION: Rt knee: Medial and lateral joint lines Distal ITB Distal lateral hamstring tendon   LOWER EXTREMITY ROM:   Active/Passive ROM Right eval Left eval  Hip flexion      Hip extension      Hip abduction      Hip adduction      Hip internal rotation      Hip external rotation  Knee flexion AROM 135 deg (07/28/21) pain  134 AROM  Knee extension 0 0  Ankle dorsiflexion      Ankle plantarflexion      Ankle inversion      Ankle eversion       (Blank rows = not tested)   LOWER EXTREMITY MMT:   MMT Right eval Left eval  Hip flexion Unable to perform SLR, 3/5    Hip extension      Hip abduction 3+ 3+  Hip adduction      Hip internal rotation      Hip external rotation      Knee flexion 4 4  Knee extension 4+ 4  Ankle dorsiflexion      Ankle plantarflexion      Ankle inversion      Ankle eversion       (Blank rows = not tested)   LOWER EXTREMITY SPECIAL TESTS:  Knee special tests: Patellafemoral grind test: negative   FUNCTIONAL TESTS:  Sit to stand with ease   GAIT: Distance walked: clinic Assistive device utilized: None Level of assistance: Complete  Independence Comments: no gait abnormality noted       TODAY'S TREATMENT: 07/31/21: Nustep: L3 5 min- PT present to discuss HEP adherence Supine short arc quads x20 with 2.5# ankle weight Supine 90/90 hamstring isometric in red physio ball 5x5 sec hold x2 sets: 2nd set with pelvic lift off mat table Supine straight leg bridge off red physio ball x5 reps  Lt sidelying clam with red TB 2x10 reps  Supine Rt piriformis stretch 2x20 sec hold- knee to opposite shoulder  Supine BLE bridge x10 reps, no hold Supine Rt SLR x10 reps Standing Rt LE step up onto 6" box, contralateral hip flexion, 2x5 reps      07/27/21: NuStep: Level 3x 6 minutes-PTA  present to discuss progress  Seated hamstring stretch: 3x20"  Supine short arc quads x10 ITB stretch in supine: 2x30 seconds  Sidelyling clams with red band: tactile cues for alignment and speed Sidelying in 90/90: isometric lift to contract abductors & hip flex hold 5 sec 2x6 Supine bridge 5 sec hold 6x Supine RTLE SLR with core contraction 6x Standing gastroc stretch: 3x20 seconds  Manual: Addaday: Rt gluteals and distal lateral hamstring    Date: 07/24/21 NuStep: Level 2x 6 minutes-PT present to discuss progress  Seated hamstring stretch: 3x20"  Supine short arc quads x10 ITB stretch in supine: 2x30 seconds  Sidelyling clams with yellow band: tactile cues for alignment and speed Standing gastroc stretch: 3x20 seconds  Manual: Addaday: Rt gluteals and distal lateral hamstring      07/12/21: Initiated HEP     PATIENT EDUCATION:  Education details: Access Code: [email or web address] Person educated: Patient Education method: Consulting civil engineer, Media planner, Verbal cues, and Handouts Education comprehension: verbalized understanding and returned demonstration     HOME EXERCISE PROGRAM: Access Code: [email or web address] URL: [email or web address] Date: 07/31/2021 Prepared by: [facility]  Exercises -  Clamshell with Resistance  - 1 x daily - 7 x weekly - 2 sets - 10 reps - Supine Hamstring Stretch with Strap  - 1 x daily - 7 x weekly - 1 sets - 3 reps - 30 hold - Supine ITB Stretch  - 1 x daily - 7 x weekly - 1 sets - 3 reps - 30 hold - Standing Gastroc Stretch at Counter  - 1 x daily - 7 x weekly - 1 sets - 3 reps -  30 hold - Supine Active Straight Leg Raise  - 1 x daily - 7 x weekly - 10 reps - Supine Hamstring Stretch with Strap  - 1 x daily - 7 x weekly - 2 sets - 3 reps - Supine Bridge  - 1 x daily - 7 x weekly - 10 reps  ASSESSMENT:   CLINICAL IMPRESSION: Pt is has no reported knee pain during or following her PT sessions.  This seems to last for about 1 day before returning. Pt is not adhering to her HEP as much as is recommended and this could be contributing to her return of knee pain during the week. Pt required PT cuing for proper glute activation/timing with step ups but was able to complete without difficulty towards the end of the set. Pt has met 2 goals today with Rt knee ROM and strength improved, evident by her ability to complete 10 reps of SLR on the Rt. She did have muscle fatigue and shaking with this, but denied knee pain. PT discussed plan to increase her HEP adherence to atleast 2 additional days a week outside of her PT visits and she was agreeable with this.    OBJECTIVE IMPAIRMENTS decreased ROM, decreased strength, hypomobility, increased edema, impaired flexibility, and pain.    ACTIVITY LIMITATIONS sitting   PARTICIPATION LIMITATIONS: meal prep and occupation   PERSONAL FACTORS 1 comorbidity: LBP and proximal hip weakness  are also affecting patient's functional outcome.    REHAB POTENTIAL: Good   CLINICAL DECISION MAKING: Stable/uncomplicated   EVALUATION COMPLEXITY: Low     GOALS: Goals reviewed with patient? Yes   SHORT TERM GOALS: Target date: 08/02/21 Pt will be ind with initial HEP without exacerbation of symptoms Baseline: Goal status:  INITIAL   2.  Pt will report at least 25% reduction in pain with sitting and meal prep. Baseline:  Goal status: INITIAL   3.  Pt will be able to perform at least 10 SLR on Rt Baseline: unable to perform 1 rep Goal status: MET (07/31/21)   4.  Pt will achieve Rt knee A/ROM at least 127 deg Baseline: 122 Goal status: MET (07/31/21)     LONG TERM GOALS: Target date: 09/07/2021    Pt will be ind with advanced HEP and understand how to safely progress. Baseline:  Goal status: INITIAL   2.  Improve FOTO to at least 88% to demo improved function.   Baseline: 80% Goal status: INITIAL   3.  Pt will achieve Rt knee A/ROM to at least 130 deg to improve sitting, gait and squatting. Baseline:  Goal status: MET   4.  Pt will report at least 75% reduction in pain with sitting at work and standing to cook meals.  Baseline:  Goal status: INITIAL   5.  Pt will achieve Rt LE flexility to WNL to reduce undue stress across hip and knee joint. Baseline:  Goal status: INITIAL   6.  Pt will achieve at least 4+/5 strength of Rt hip and knee to provide better support to knee joint. Baseline:  Goal status: INITIAL     PLAN: PT FREQUENCY: 2x/week   PT DURATION: 6 weeks   PLANNED INTERVENTIONS: Therapeutic exercises, Therapeutic activity, Neuromuscular re-education, Balance training, Gait training, Patient/Family education, Joint mobilization, Dry Needling, Electrical stimulation, Cryotherapy, Moist heat, Taping, Vasopneumatic device, and Manual therapy   PLAN FOR NEXT SESSION:  f/u on HEP adherence, progress knee and hip strength, flexibility      5:12 PM,07/31/21 Sherol Dade  PT, DPT [place] [facility] [place]

## 2021-08-06 ENCOUNTER — Ambulatory Visit: Payer: BC Managed Care – PPO | Admitting: Physical Therapy

## 2021-08-06 ENCOUNTER — Encounter: Payer: Self-pay | Admitting: Physical Therapy

## 2021-08-06 DIAGNOSIS — G8929 Other chronic pain: Secondary | ICD-10-CM

## 2021-08-06 DIAGNOSIS — M25561 Pain in right knee: Secondary | ICD-10-CM | POA: Diagnosis not present

## 2021-08-06 DIAGNOSIS — M6281 Muscle weakness (generalized): Secondary | ICD-10-CM

## 2021-08-06 NOTE — Therapy (Signed)
OUTPATIENT PHYSICAL THERAPY TREATMENT NOTE   Patient Name: Jamie [name] MRN: [id number] DOB:02/24/1985, 36 y.o., female Today's Date: 08/06/2021   END OF SESSION:   PT End of Session - 08/06/21 1406     Visit Number 5    Date for PT Re-Evaluation 08/23/21    Authorization Type BCBS    PT Start Time 1405    PT Stop Time 1440    PT Time Calculation (min) 35 min    Activity Tolerance Patient tolerated treatment well;No increased pain    Behavior During Therapy WFL for tasks assessed/performed              Past Medical History:  Diagnosis Date   GERD (gastroesophageal reflux disease)    Medical history non-contributory    Past Surgical History:  Procedure Laterality Date   DILATION AND EVACUATION N/A 06/08/2019   Procedure: DILATATION AND EVACUATION;  Surgeon: Jerelyn Charles, MD;  Location: [facility];  Service: Gynecology;  Laterality: N/A;   NO PAST SURGERIES     Patient Active Problem List   Diagnosis Date Noted   Pregnancy affected by fetal growth restriction 05/08/2020   Normal labor 06/01/2017       REFERRING DIAG: M25.561 (ICD-10-CM) - Pain in right knee    THERAPY DIAG:  Chronic pain of right knee   Muscle weakness (generalized)   Rationale for Evaluation and Treatment Rehabilitation   ONSET DATE: one month ago, insidious onset   SUBJECTIVE:    SUBJECTIVE STATEMENT: I am doing better.   PERTINENT HISTORY: LBP, has two young children   PAIN:  PAIN:  Are you having pain? No not right now NPRS scale: 0/10 Pain location: Rt knee Pain orientation: Right, medial and lateral, deep PAIN TYPE: aching and deep Pain description:   Aggravating factors: prolonged sitting Relieving factors: just started Rx yesterday and maybe is helping, bandaging knee     PRECAUTIONS: None   WEIGHT BEARING RESTRICTIONS No   FALLS:  Has patient fallen in last 6 months? No   LIVING ENVIRONMENT: Lives with: lives with their family Lives in:  House/apartment Stairs: No Has following equipment at home: None   OCCUPATION: desk job, full time   PLOF: Independent   PATIENT GOALS feel better      OBJECTIVE: (objective measures completed at initial evaluation unless otherwise dated)   DIAGNOSTIC FINDINGS: Trace early patellofemoral spurring    PATIENT SURVEYS:  FOTO 80%, goal 88%   COGNITION:           Overall cognitive status: Within functional limits for tasks assessed                          SENSATION: WFL   EDEMA:  Mild infrapatellar edema present   MUSCLE LENGTH: Hamstrings: Right 70 deg; Left 70 deg Rt ITB tight Bil gluteals and piriformis tight   POSTURE: No Significant postural limitations   PALPATION: Rt knee: Medial and lateral joint lines Distal ITB Distal lateral hamstring tendon   LOWER EXTREMITY ROM:   Active/Passive ROM Right eval Left eval  Hip flexion      Hip extension      Hip abduction      Hip adduction      Hip internal rotation      Hip external rotation      Knee flexion AROM 135 deg (07/28/21) pain  134 AROM  Knee extension 0 0  Ankle dorsiflexion  Ankle plantarflexion      Ankle inversion      Ankle eversion       (Blank rows = not tested)   LOWER EXTREMITY MMT:   MMT Right eval Left eval  Hip flexion Unable to perform SLR, 3/5    Hip extension      Hip abduction 3+ 3+  Hip adduction      Hip internal rotation      Hip external rotation      Knee flexion 4 4  Knee extension 4+ 4  Ankle dorsiflexion      Ankle plantarflexion      Ankle inversion      Ankle eversion       (Blank rows = not tested)   LOWER EXTREMITY SPECIAL TESTS:  Knee special tests: Patellafemoral grind test: negative   FUNCTIONAL TESTS:  Sit to stand with ease   GAIT: Distance walked: clinic Assistive device utilized: None Level of assistance: Complete Independence Comments: no gait abnormality noted       TODAY'S TREATMENT:   08/06/21: Nustep: L3 10 min- PTA present to  discuss HEP adherence Supine short arc quads x20 with 2.5# ankle weight Supine 90/90 hamstring isometric in red physio ball 5x5 sec hold x2 sets: 2nd set with pelvic lift off mat table Supine straight leg bridge off red physio ball 2x5 reps  Lt sidelying clam with red TB 2x10 reps  Supine Rt piriformis stretch 2x20 sec hold- knee to opposite shoulder  Supine BLE bridge x10 reps, no hold: repeat on ball 10x Standing Rt LE step up onto 6" box, contralateral hip flexion, 2x10 reps  Blue loop above knee: side stepping down in front of mirror to stairs : 4 lengths x2    07/31/21: Nustep: L3 5 min- PT present to discuss HEP adherence Supine short arc quads x20 with 2.5# ankle weight Supine 90/90 hamstring isometric in red physio ball 5x5 sec hold x2 sets: 2nd set with pelvic lift off mat table Supine straight leg bridge off red physio ball x5 reps  Lt sidelying clam with red TB 2x10 reps  Supine Rt piriformis stretch 2x20 sec hold- knee to opposite shoulder  Supine BLE bridge x10 reps, no hold Supine Rt SLR x10 reps Standing Rt LE step up onto 6" box, contralateral hip flexion, 2x5 reps      07/27/21: NuStep: Level 3x 6 minutes-PTA  present to discuss progress  Seated hamstring stretch: 3x20"  Supine short arc quads x10 ITB stretch in supine: 2x30 seconds  Sidelyling clams with red band: tactile cues for alignment and speed Sidelying in 90/90: isometric lift to contract abductors & hip flex hold 5 sec 2x6 Supine bridge 5 sec hold 6x Supine RTLE SLR with core contraction 6x Standing gastroc stretch: 3x20 seconds  Manual: Addaday: Rt gluteals and distal lateral hamstring      07/12/21: Initiated HEP     PATIENT EDUCATION:  Education details: Access Code: [email or web address] Person educated: Patient Education method: Consulting civil engineer, Media planner, Verbal cues, and Handouts Education comprehension: verbalized understanding and returned demonstration     HOME EXERCISE PROGRAM: Access Code:  [email or web address] URL: [email or web address] Date: 07/31/2021 Prepared by: [facility]  Exercises - Clamshell with Resistance  - 1 x daily - 7 x weekly - 2 sets - 10 reps - Supine Hamstring Stretch with Strap  - 1 x daily - 7 x weekly - 1 sets - 3 reps - 30 hold - Supine ITB Stretch  -  1 x daily - 7 x weekly - 1 sets - 3 reps - 30 hold - Standing Gastroc Stretch at Counter  - 1 x daily - 7 x weekly - 1 sets - 3 reps - 30 hold - Supine Active Straight Leg Raise  - 1 x daily - 7 x weekly - 10 reps - Supine Hamstring Stretch with Strap  - 1 x daily - 7 x weekly - 2 sets - 3 reps - Supine Bridge  - 1 x daily - 7 x weekly - 10 reps  ASSESSMENT:   CLINICAL IMPRESSION: Pt arrives with no knee pain and verbally reports her "knee is doing better." Pt currently challenged by exercises, fatigues quickly but no pain reported.    OBJECTIVE IMPAIRMENTS decreased ROM, decreased strength, hypomobility, increased edema, impaired flexibility, and pain.    ACTIVITY LIMITATIONS sitting   PARTICIPATION LIMITATIONS: meal prep and occupation   PERSONAL FACTORS 1 comorbidity: LBP and proximal hip weakness  are also affecting patient's functional outcome.    REHAB POTENTIAL: Good   CLINICAL DECISION MAKING: Stable/uncomplicated   EVALUATION COMPLEXITY: Low     GOALS: Goals reviewed with patient? Yes   SHORT TERM GOALS: Target date: 08/02/21 Pt will be ind with initial HEP without exacerbation of symptoms Baseline: Goal status: INITIAL   2.  Pt will report at least 25% reduction in pain with sitting and meal prep. Baseline:  Goal status: INITIAL   3.  Pt will be able to perform at least 10 SLR on Rt Baseline: unable to perform 1 rep Goal status: MET (07/31/21)   4.  Pt will achieve Rt knee A/ROM at least 127 deg Baseline: 122 Goal status: MET (07/31/21)     LONG TERM GOALS: Target date: 09/07/2021    Pt will be ind with advanced HEP and  understand how to safely progress. Baseline:  Goal status: INITIAL   2.  Improve FOTO to at least 88% to demo improved function.   Baseline: 80% Goal status: INITIAL   3.  Pt will achieve Rt knee A/ROM to at least 130 deg to improve sitting, gait and squatting. Baseline:  Goal status: MET   4.  Pt will report at least 75% reduction in pain with sitting at work and standing to cook meals.  Baseline:  Goal status: INITIAL   5.  Pt will achieve Rt LE flexility to WNL to reduce undue stress across hip and knee joint. Baseline:  Goal status: INITIAL   6.  Pt will achieve at least 4+/5 strength of Rt hip and knee to provide better support to knee joint. Baseline:  Goal status: INITIAL     PLAN: PT FREQUENCY: 2x/week   PT DURATION: 6 weeks   PLANNED INTERVENTIONS: Therapeutic exercises, Therapeutic activity, Neuromuscular re-education, Balance training, Gait training, Patient/Family education, Joint mobilization, Dry Needling, Electrical stimulation, Cryotherapy, Moist heat, Taping, Vasopneumatic device, and Manual therapy   PLAN FOR NEXT SESSION:  Progress knee and hip strength, flexibility: MMT next session    Myrene Galas, PTA 08/06/21 2:40 PM  2:40 PM,08/06/21

## 2021-08-08 ENCOUNTER — Ambulatory Visit: Payer: BC Managed Care – PPO | Admitting: Physical Therapy

## 2021-08-08 ENCOUNTER — Encounter: Payer: Self-pay | Admitting: Physical Therapy

## 2021-08-08 DIAGNOSIS — M25561 Pain in right knee: Secondary | ICD-10-CM | POA: Diagnosis not present

## 2021-08-08 DIAGNOSIS — M6281 Muscle weakness (generalized): Secondary | ICD-10-CM

## 2021-08-08 DIAGNOSIS — G8929 Other chronic pain: Secondary | ICD-10-CM

## 2021-08-08 NOTE — Therapy (Signed)
OUTPATIENT PHYSICAL THERAPY TREATMENT NOTE   Patient Name: Jamie [name] MRN: [id number] DOB:[birth year], 36 y.o., female Today's Date: 08/08/2021   END OF SESSION:   PT End of Session - 08/08/21 1018     Visit Number 6    Date for PT Re-Evaluation 08/23/21    Authorization Type BCBS    PT Start Time 1018    PT Stop Time 1056    PT Time Calculation (min) 38 min    Activity Tolerance Patient tolerated treatment well;No increased pain    Behavior During Therapy WFL for tasks assessed/performed               Past Medical History:  Diagnosis Date   GERD (gastroesophageal reflux disease)    Medical history non-contributory    Past Surgical History:  Procedure Laterality Date   DILATION AND EVACUATION N/A 06/08/2019   Procedure: DILATATION AND EVACUATION;  Surgeon: Jerelyn Charles, MD;  Location: [facility];  Service: Gynecology;  Laterality: N/A;   NO PAST SURGERIES     Patient Active Problem List   Diagnosis Date Noted   Pregnancy affected by fetal growth restriction 05/08/2020   Normal labor 06/01/2017       REFERRING DIAG: M25.561 (ICD-10-CM) - Pain in right knee    THERAPY DIAG:  Chronic pain of right knee   Muscle weakness (generalized)   Rationale for Evaluation and Treatment Rehabilitation   ONSET DATE: one month ago, insidious onset   SUBJECTIVE:    SUBJECTIVE STATEMENT: On Monday I had no pain.  I sometimes go 2-3 days without pain.  Today ranging from 3-5/10   PERTINENT HISTORY: LBP, has two young children   PAIN:  PAIN:  Are you having pain? No not right now NPRS scale: 3-5/10 today, some days no pain lately Pain location: Rt knee Pain orientation: Right, medial and lateral, deep PAIN TYPE: aching and deep Pain description:   Aggravating factors: prolonged sitting Relieving factors: just started Rx yesterday and maybe is helping, bandaging knee     PRECAUTIONS: None   WEIGHT BEARING RESTRICTIONS No   FALLS:  Has patient  fallen in last 6 months? No   LIVING ENVIRONMENT: Lives with: lives with their family Lives in: House/apartment Stairs: No Has following equipment at home: None   OCCUPATION: desk job, full time   PLOF: Independent   PATIENT GOALS feel better      OBJECTIVE: (objective measures completed at initial evaluation unless otherwise dated)   DIAGNOSTIC FINDINGS: Trace early patellofemoral spurring    PATIENT SURVEYS:  FOTO 80%, goal 88%   COGNITION:           Overall cognitive status: Within functional limits for tasks assessed                          SENSATION: WFL   EDEMA:  Mild infrapatellar edema present   MUSCLE LENGTH: Hamstrings: Right 70 deg; Left 70 deg Rt ITB tight Bil gluteals and piriformis tight   POSTURE: No Significant postural limitations   PALPATION: Rt knee: Medial and lateral joint lines Distal ITB Distal lateral hamstring tendon   LOWER EXTREMITY ROM:   Active/Passive ROM Right eval Left eval  Hip flexion      Hip extension      Hip abduction      Hip adduction      Hip internal rotation      Hip external rotation      Knee  flexion AROM 135 deg (07/28/21) pain  134 AROM  Knee extension 0 0  Ankle dorsiflexion      Ankle plantarflexion      Ankle inversion      Ankle eversion       (Blank rows = not tested)   LOWER EXTREMITY MMT:   MMT Right eval Left eval  Hip flexion Unable to perform SLR, 3/5    Hip extension      Hip abduction 3+ 3+  Hip adduction      Hip internal rotation      Hip external rotation      Knee flexion 4 4  Knee extension 4+ 4  Ankle dorsiflexion      Ankle plantarflexion      Ankle inversion      Ankle eversion       (Blank rows = not tested)   LOWER EXTREMITY SPECIAL TESTS:  Knee special tests: Patellafemoral grind test: negative   FUNCTIONAL TESTS:  Sit to stand with ease   GAIT: Distance walked: clinic Assistive device utilized: None Level of assistance: Complete Independence Comments: no  gait abnormality noted       TODAY'S TREATMENT:   08/08/21: Nustep: L3 8 min- PTA present to discuss HEP adherence Supine short arc quads x20 with 2.5# ankle weight Supine 90/90 hamstring isometric in red physio ball 5x5 sec hold x2 sets: 2nd set with pelvic lift off mat table Supine straight leg bridge off red physio ball 2x5 reps  Supine hooklying piriformis stretch 1x30" each side SLR Rt off blue bolster x 10 Sidelying clam with red TB 2x10 reps bil SL hip abd from blue bolster 2x5 bil Sit to stand from mat table (+ airex pad) feet on blue airex pad hold 5lb x 20 Standing Rt LE step up onto 6" box, contralateral hip flexion, 2x10 reps  Blue loop above knee: side stepping down in front of mirror to stairs : 4 lengths x2 Rt SLS 3-way taps with Lt foot in front of mirror x 5 rounds Rt SLS x 20 sec    08/06/21: Nustep: L3 10 min- PTA present to discuss HEP adherence Supine short arc quads x20 with 2.5# ankle weight Supine 90/90 hamstring isometric in red physio ball 5x5 sec hold x2 sets: 2nd set with pelvic lift off mat table Supine straight leg bridge off red physio ball 2x5 reps  Lt sidelying clam with red TB 2x10 reps  Supine Rt piriformis stretch 2x20 sec hold- knee to opposite shoulder  Supine BLE bridge x10 reps, no hold: repeat on ball 10x Standing Rt LE step up onto 6" box, contralateral hip flexion, 2x10 reps  Blue loop above knee: side stepping down in front of mirror to stairs : 4 lengths x2    07/31/21: Nustep: L3 5 min- PT present to discuss HEP adherence Supine short arc quads x20 with 2.5# ankle weight Supine 90/90 hamstring isometric in red physio ball 5x5 sec hold x2 sets: 2nd set with pelvic lift off mat table Supine straight leg bridge off red physio ball x5 reps  Lt sidelying clam with red TB 2x10 reps  Supine Rt piriformis stretch 2x20 sec hold- knee to opposite shoulder  Supine BLE bridge x10 reps, no hold Supine Rt SLR x10 reps Standing Rt LE step up onto  6" box, contralateral hip flexion, 2x5 reps      PATIENT EDUCATION:  Education details: Access Code: [email or web address] Person educated: Patient Education method: Explanation, Demonstration, Verbal cues, and Handouts  Education comprehension: verbalized understanding and returned demonstration     HOME EXERCISE PROGRAM: Access Code: [email or web address] URL: [email or web address] Date: 07/31/2021 Prepared by: [facility]  Exercises - Clamshell with Resistance  - 1 x daily - 7 x weekly - 2 sets - 10 reps - Supine Hamstring Stretch with Strap  - 1 x daily - 7 x weekly - 1 sets - 3 reps - 30 hold - Supine ITB Stretch  - 1 x daily - 7 x weekly - 1 sets - 3 reps - 30 hold - Standing Gastroc Stretch at Counter  - 1 x daily - 7 x weekly - 1 sets - 3 reps - 30 hold - Supine Active Straight Leg Raise  - 1 x daily - 7 x weekly - 10 reps - Supine Hamstring Stretch with Strap  - 1 x daily - 7 x weekly - 2 sets - 3 reps - Supine Bridge  - 1 x daily - 7 x weekly - 10 reps  ASSESSMENT:   CLINICAL IMPRESSION: Pt demos signif improvement in quad recruitment and performance of therex.  She is now able to do SLR on Rt from bolster x 10.  She reports some days she is painfree.  Pain never exceeds 5/10.  She notes pain after driving for prolonged times but cooking and sitting at home is better.  She demo'd good balance and control in SLS on Rt today with static balance and with clock taps.     OBJECTIVE IMPAIRMENTS decreased ROM, decreased strength, hypomobility, increased edema, impaired flexibility, and pain.    ACTIVITY LIMITATIONS sitting   PARTICIPATION LIMITATIONS: meal prep and occupation   PERSONAL FACTORS 1 comorbidity: LBP and proximal hip weakness  are also affecting patient's functional outcome.    REHAB POTENTIAL: Good   CLINICAL DECISION MAKING: Stable/uncomplicated   EVALUATION COMPLEXITY: Low     GOALS: Goals reviewed with patient? Yes    SHORT TERM GOALS: Target date: 08/02/21 Pt will be ind with initial HEP without exacerbation of symptoms Baseline: Goal status: met   2.  Pt will report at least 25% reduction in pain with sitting and meal prep. Baseline:  Goal status: met  3.  Pt will be able to perform at least 10 SLR on Rt Baseline: unable to perform 1 rep Goal status: MET (07/31/21)   4.  Pt will achieve Rt knee A/ROM at least 127 deg Baseline: 122 Goal status: MET (07/31/21)     LONG TERM GOALS: Target date: 09/07/2021    Pt will be ind with advanced HEP and understand how to safely progress. Baseline:  Goal status: ongoing   2.  Improve FOTO to at least 88% to demo improved function.   Baseline: 80% Goal status: INITIAL   3.  Pt will achieve Rt knee A/ROM to at least 130 deg to improve sitting, gait and squatting. Baseline:  Goal status: MET   4.  Pt will report at least 75% reduction in pain with sitting at work and standing to cook meals.  Baseline:  Goal status: met   5.  Pt will achieve Rt LE flexility to WNL to reduce undue stress across hip and knee joint. Baseline:  Goal status: ongoing   6.  Pt will achieve at least 4+/5 strength of Rt hip and knee to provide better support to knee joint. Baseline:  Goal status: ongoing     PLAN: PT FREQUENCY: 2x/week   PT DURATION: 6 weeks  PLANNED INTERVENTIONS: Therapeutic exercises, Therapeutic activity, Neuromuscular re-education, Balance training, Gait training, Patient/Family education, Joint mobilization, Dry Needling, Electrical stimulation, Cryotherapy, Moist heat, Taping, Vasopneumatic device, and Manual therapy   PLAN FOR NEXT SESSION:  Progress knee and hip strength, flexibility: MMT next session    Filippo Puls, PT 08/08/21 10:56 AM

## 2021-08-14 ENCOUNTER — Ambulatory Visit: Payer: BC Managed Care – PPO | Attending: Sports Medicine | Admitting: Physical Therapy

## 2021-08-14 ENCOUNTER — Encounter: Payer: Self-pay | Admitting: Physical Therapy

## 2021-08-14 DIAGNOSIS — G8929 Other chronic pain: Secondary | ICD-10-CM | POA: Insufficient documentation

## 2021-08-14 DIAGNOSIS — M25561 Pain in right knee: Secondary | ICD-10-CM | POA: Diagnosis present

## 2021-08-14 DIAGNOSIS — M6281 Muscle weakness (generalized): Secondary | ICD-10-CM | POA: Insufficient documentation

## 2021-08-14 NOTE — Therapy (Signed)
OUTPATIENT PHYSICAL THERAPY TREATMENT NOTE   Patient Name: Jamie [name] MRN: [id number] DOB:09/30/1985, 36 y.o., female Today's Date: 08/14/2021   END OF SESSION:   PT End of Session - 08/14/21 1104     Visit Number 7    Date for PT Re-Evaluation 08/23/21    Authorization Type BCBS    PT Start Time 1104    PT Stop Time 1145    PT Time Calculation (min) 41 min    Activity Tolerance Patient tolerated treatment well;No increased pain    Behavior During Therapy WFL for tasks assessed/performed                Past Medical History:  Diagnosis Date   GERD (gastroesophageal reflux disease)    Medical history non-contributory    Past Surgical History:  Procedure Laterality Date   DILATION AND EVACUATION N/A 06/08/2019   Procedure: DILATATION AND EVACUATION;  Surgeon: Jerelyn Charles, MD;  Location: [facility];  Service: Gynecology;  Laterality: N/A;   NO PAST SURGERIES     Patient Active Problem List   Diagnosis Date Noted   Pregnancy affected by fetal growth restriction 05/08/2020   Normal labor 06/01/2017       REFERRING DIAG: M25.561 (ICD-10-CM) - Pain in right knee    THERAPY DIAG:  Chronic pain of right knee   Muscle weakness (generalized)   Rationale for Evaluation and Treatment Rehabilitation   ONSET DATE: one month ago, insidious onset   SUBJECTIVE:    SUBJECTIVE STATEMENT: I haven't had any knee pain since Friday - 3 days in a row are painfree.   PERTINENT HISTORY: LBP, has two young children   PAIN:  PAIN:  Are you having pain? No not right now NPRS scale: 0/10 Pain location: Rt knee Pain orientation: Right, medial and lateral, deep PAIN TYPE: aching and deep Pain description:   Aggravating factors: prolonged sitting Relieving factors: just started Rx yesterday and maybe is helping, bandaging knee     PRECAUTIONS: None   WEIGHT BEARING RESTRICTIONS No   FALLS:  Has patient fallen in last 6 months? No   LIVING  ENVIRONMENT: Lives with: lives with their family Lives in: House/apartment Stairs: No Has following equipment at home: None   OCCUPATION: desk job, full time   PLOF: Independent   PATIENT GOALS feel better      OBJECTIVE: (objective measures completed at initial evaluation unless otherwise dated)   DIAGNOSTIC FINDINGS: Trace early patellofemoral spurring    PATIENT SURVEYS:  08/14/21: FOTO 99%, goal met, removed from FOTO Eval: FOTO 80%, goal 88%   COGNITION:           Overall cognitive status: Within functional limits for tasks assessed                          SENSATION: WFL   EDEMA:  Mild infrapatellar edema present   MUSCLE LENGTH: Hamstrings: Right 70 deg; Left 70 deg Rt ITB tight Bil gluteals and piriformis tight   POSTURE: No Significant postural limitations   PALPATION: Rt knee: Medial and lateral joint lines Distal ITB Distal lateral hamstring tendon   LOWER EXTREMITY ROM:   Active/Passive ROM Right eval Left eval  Hip flexion      Hip extension      Hip abduction      Hip adduction      Hip internal rotation      Hip external rotation  Knee flexion AROM 135 deg (07/28/21) pain  134 AROM  Knee extension 0 0  Ankle dorsiflexion      Ankle plantarflexion      Ankle inversion      Ankle eversion       (Blank rows = not tested)   LOWER EXTREMITY MMT:   MMT Right eval Left eval Right 8/1 Left 8/1  Hip flexion Unable to perform SLR, 3/5   4+ 4+  Hip extension        Hip abduction 3+ 3+ 4 4  Hip adduction        Hip internal rotation        Hip external rotation        Knee flexion 4 4 4+ 4+  Knee extension 4+ _0 Ankle dorsiflexion        Ankle plantarflexion        Ankle inversion        Ankle eversion         (Blank rows = not tested)   LOWER EXTREMITY SPECIAL TESTS:  Knee special tests: Patellafemoral grind test: negative   FUNCTIONAL TESTS:  Sit to stand with ease   GAIT: Distance walked: clinic Assistive device  utilized: None Level of assistance: Complete Independence Comments: no gait abnormality noted       TODAY'S TREATMENT: 08/14/21: NuStep L5 x 8' PT present to monitor and discuss progress Supine short arc quads x20 with 2.5# ankle weight SAQ to SLR off bolster x 8 2.5lb ankle weight, Rt Supine 90/90 hamstring isometric in red physio ball 5x5 sec hold x2 sets: 2nd set with pelvic lift off mat table Supine straight leg bridge off red physio ball x10 Supine hooklying piriformis stretch 1x30" each side Sidelying clam with red TB x20 reps bil SL hip abd x 8 1lb ankle wieght, bil Sit to stand from mat table feet on blue airex pad hold 5lb x 20 LE step up onto 6" box, contralateral hip flexion, 1x15 each LE red loop above knee: side stepping and monster walk fwd/bwd x 1 laps each covering 20' SLS 3-way "air taps" with Lt foot in front of mirror x 8 rounds on each LE    08/08/21: Nustep: L3 8 min- PTA present to discuss HEP adherence Supine short arc quads x20 with 2.5# ankle weight Supine 90/90 hamstring isometric in red physio ball 5x5 sec hold x2 sets: 2nd set with pelvic lift off mat table Supine straight leg bridge off red physio ball 2x5 reps  Supine hooklying piriformis stretch 1x30" each side SLR Rt off blue bolster x 10 Sidelying clam with red TB 2x10 reps bil SL hip abd from blue bolster 2x5 bil Sit to stand from mat table (+ airex pad) feet on blue airex pad hold 5lb x 20 Standing Rt LE step up onto 6" box, contralateral hip flexion, 2x10 reps  Blue loop above knee: side stepping down in front of mirror to stairs : 4 lengths x2 Rt SLS 3-way taps with Lt foot in front of mirror x 5 rounds Rt SLS x 20 sec    08/06/21: Nustep: L3 10 min- PTA present to discuss HEP adherence Supine short arc quads x20 with 2.5# ankle weight Supine 90/90 hamstring isometric in red physio ball 5x5 sec hold x2 sets: 2nd set with pelvic lift off mat table Supine straight leg bridge off red physio ball  2x5 reps  Lt sidelying clam with red TB 2x10 reps  Supine Rt piriformis  stretch 2x20 sec hold- knee to opposite shoulder  Supine BLE bridge x10 reps, no hold: repeat on ball 10x Standing Rt LE step up onto 6" box, contralateral hip flexion, 2x10 reps  Blue loop above knee: side stepping down in front of mirror to stairs : 4 lengths x2     PATIENT EDUCATION:  Education details: Access Code: [email or web address] Person educated: Patient Education method: Consulting civil engineer, Demonstration, Verbal cues, and Handouts Education comprehension: verbalized understanding and returned demonstration     HOME EXERCISE PROGRAM: Access Code: [email or web address] URL: [email or web address] Date: 07/31/2021 Prepared by: [facility]  Exercises - Clamshell with Resistance  - 1 x daily - 7 x weekly - 2 sets - 10 reps - Supine Hamstring Stretch with Strap  - 1 x daily - 7 x weekly - 1 sets - 3 reps - 30 hold - Supine ITB Stretch  - 1 x daily - 7 x weekly - 1 sets - 3 reps - 30 hold - Standing Gastroc Stretch at Counter  - 1 x daily - 7 x weekly - 1 sets - 3 reps - 30 hold - Supine Active Straight Leg Raise  - 1 x daily - 7 x weekly - 10 reps - Supine Hamstring Stretch with Strap  - 1 x daily - 7 x weekly - 2 sets - 3 reps - Supine Bridge  - 1 x daily - 7 x weekly - 10 reps  ASSESSMENT:   CLINICAL IMPRESSION: Pt reports she has been painfree for 3 days, even with driving.  She scores 99% on FOTO today, exceeding goal.  MMT have dramatically improved, meeting goal.  She continues to feel signif improvement with sitting and standing for cooking.  She has much improved quad recruitment and exercise tolerance for increased reps/resistance.  She had onset of medial Rt knee pain today with sidestepping but was performing deep squat in sidestep with some valgus angle which improved when cued for form.  Anticipate she will be ready for d/c at [facility] on 08/21/21.   OBJECTIVE IMPAIRMENTS  decreased ROM, decreased strength, hypomobility, increased edema, impaired flexibility, and pain.    ACTIVITY LIMITATIONS sitting   PARTICIPATION LIMITATIONS: meal prep and occupation   PERSONAL FACTORS 1 comorbidity: LBP and proximal hip weakness  are also affecting patient's functional outcome.    REHAB POTENTIAL: Good   CLINICAL DECISION MAKING: Stable/uncomplicated   EVALUATION COMPLEXITY: Low     GOALS: Goals reviewed with patient? Yes   SHORT TERM GOALS: Target date: 08/02/21 Pt will be ind with initial HEP without exacerbation of symptoms Baseline: Goal status: met   2.  Pt will report at least 25% reduction in pain with sitting and meal prep. Baseline:  Goal status: met  3.  Pt will be able to perform at least 10 SLR on Rt Baseline: unable to perform 1 rep Goal status: MET (07/31/21)   4.  Pt will achieve Rt knee A/ROM at least 127 deg Baseline: 122 Goal status: MET (07/31/21)     LONG TERM GOALS: Target date: 09/07/2021    Pt will be ind with advanced HEP and understand how to safely progress. Baseline:  Goal status: ongoing   2.  Improve FOTO to at least 88% to demo improved function.   Baseline: 80%, 99% on 08/14/21 Goal status: met 3.  Pt will achieve Rt knee A/ROM to at least 130 deg to improve sitting, gait and squatting. Baseline:  Goal status: MET   4.  Pt will report at least 75% reduction in pain with sitting at work and standing to cook meals.  Baseline:  Goal status: met   5.  Pt will achieve Rt LE flexility to WNL to reduce undue stress across hip and knee joint. Baseline:  Goal status: ongoing   6.  Pt will achieve at least 4+/5 strength of Rt hip and knee to provide better support to knee joint. Baseline:  Goal status: met     PLAN: PT FREQUENCY: 2x/week   PT DURATION: 6 weeks   PLANNED INTERVENTIONS: Therapeutic exercises, Therapeutic activity, Neuromuscular re-education, Balance training, Gait training, Patient/Family education,  Joint mobilization, Dry Needling, Electrical stimulation, Cryotherapy, Moist heat, Taping, Vasopneumatic device, and Manual therapy   PLAN FOR NEXT SESSION:  finalize HEP, anticipate d/c on 8/8 [facility]    Ameliah Baskins, PT 08/14/21 11:47 AM

## 2021-08-17 ENCOUNTER — Ambulatory Visit: Payer: BC Managed Care – PPO | Admitting: Physical Therapy

## 2021-08-17 ENCOUNTER — Encounter: Payer: Self-pay | Admitting: Physical Therapy

## 2021-08-17 DIAGNOSIS — M6281 Muscle weakness (generalized): Secondary | ICD-10-CM

## 2021-08-17 DIAGNOSIS — G8929 Other chronic pain: Secondary | ICD-10-CM

## 2021-08-17 DIAGNOSIS — M25561 Pain in right knee: Secondary | ICD-10-CM | POA: Diagnosis not present

## 2021-08-17 NOTE — Therapy (Signed)
OUTPATIENT PHYSICAL THERAPY TREATMENT NOTE   Patient Name: Jamie [name] MRN: [id number] DOB:1985-08-08, 36 y.o., female Today's Date: 08/17/2021   END OF SESSION:   PT End of Session - 08/17/21 1020     Visit Number 8    Date for PT Re-Evaluation 08/23/21    Authorization Type BCBS    PT Start Time 1020   late   PT Stop Time 1046    PT Time Calculation (min) 26 min    Activity Tolerance Patient tolerated treatment well    Behavior During Therapy WFL for tasks assessed/performed                 Past Medical History:  Diagnosis Date   GERD (gastroesophageal reflux disease)    Medical history non-contributory    Past Surgical History:  Procedure Laterality Date   DILATION AND EVACUATION N/A 06/08/2019   Procedure: DILATATION AND EVACUATION;  Surgeon: Jerelyn Charles, MD;  Location: [facility];  Service: Gynecology;  Laterality: N/A;   NO PAST SURGERIES     Patient Active Problem List   Diagnosis Date Noted   Pregnancy affected by fetal growth restriction 05/08/2020   Normal labor 06/01/2017       REFERRING DIAG: M25.561 (ICD-10-CM) - Pain in right knee    THERAPY DIAG:  Chronic pain of right knee   Muscle weakness (generalized)   Rationale for Evaluation and Treatment Rehabilitation   ONSET DATE: one month ago, insidious onset   SUBJECTIVE: Pt continues to do well, no complaints all week.   SUBJECTIVE STATEMENT:    PERTINENT HISTORY: LBP, has two young children   PAIN:  PAIN:  Are you having pain? No not right now NPRS scale: 0/10 Pain location: Rt knee Pain orientation: Right, medial and lateral, deep PAIN TYPE: aching and deep Pain description:   Aggravating factors: prolonged sitting Relieving factors: just started Rx yesterday and maybe is helping, bandaging knee     PRECAUTIONS: None   WEIGHT BEARING RESTRICTIONS No   FALLS:  Has patient fallen in last 6 months? No   LIVING ENVIRONMENT: Lives with: lives with their  family Lives in: House/apartment Stairs: No Has following equipment at home: None   OCCUPATION: desk job, full time   PLOF: Independent   PATIENT GOALS feel better      OBJECTIVE: (objective measures completed at initial evaluation unless otherwise dated)   DIAGNOSTIC FINDINGS: Trace early patellofemoral spurring    PATIENT SURVEYS:  08/14/21: FOTO 99%, goal met, removed from FOTO Eval: FOTO 80%, goal 88%   COGNITION:           Overall cognitive status: Within functional limits for tasks assessed                          SENSATION: WFL   EDEMA:  Mild infrapatellar edema present   MUSCLE LENGTH: Hamstrings: Right 70 deg; Left 70 deg Rt ITB tight Bil gluteals and piriformis tight   POSTURE: No Significant postural limitations   PALPATION: Rt knee: Medial and lateral joint lines Distal ITB Distal lateral hamstring tendon   LOWER EXTREMITY ROM:   Active/Passive ROM Right eval Left eval  Hip flexion      Hip extension      Hip abduction      Hip adduction      Hip internal rotation      Hip external rotation      Knee flexion AROM 135 deg (07/28/21)  pain  134 AROM  Knee extension 0 0  Ankle dorsiflexion      Ankle plantarflexion      Ankle inversion      Ankle eversion       (Blank rows = not tested)   LOWER EXTREMITY MMT:   MMT Right eval Left eval Right 8/1 Left 8/1  Hip flexion Unable to perform SLR, 3/5   4+ 4+  Hip extension        Hip abduction 3+ 3+ 4 4  Hip adduction        Hip internal rotation        Hip external rotation        Knee flexion 4 4 4+ 4+  Knee extension 4+ 4 5 5   Ankle dorsiflexion        Ankle plantarflexion        Ankle inversion        Ankle eversion         (Blank rows = not tested)   LOWER EXTREMITY SPECIAL TESTS:  Knee special tests: Patellafemoral grind test: negative   FUNCTIONAL TESTS:  Sit to stand with ease   GAIT: Distance walked: clinic Assistive device utilized: None Level of assistance: Complete  Independence Comments: no gait abnormality noted       TODAY'S TREATMENT:  08/17/21: NuStep L5 x 5' PTA present to monitor and discuss progress Supine short arc quads x20 with 3# ankle weight SAQ to SLR off bolster x 10 3lb ankle weight, Rt Supine LE on red ball bridge 3 sec hold 10x Supine straight leg bridge off red physio ball x10 Supine hooklying piriformis stretch 1x30" each side Sidelying clam with red TB x20 reps bil SL hip abd 2x10 1lb ankle wieght, bil Sit to stand from mat table feet on blue airex pad hold 5lb x 20 LE step up onto 6" box, contralateral hip flexion, 1x15 each LE red loop above knee: side stepping and monster walk fwd/bwd 4 lengths each SLS 3-way "air taps" with Lt foot in front of mirror x 8 rounds on each LE  08/14/21: NuStep L5 x 8' PT present to monitor and discuss progress Supine short arc quads x20 with 2.5# ankle weight SAQ to SLR off bolster x 8 2.5lb ankle weight, Rt Supine 90/90 hamstring isometric in red physio ball 5x5 sec hold x2 sets: 2nd set with pelvic lift off mat table Supine straight leg bridge off red physio ball x10 Supine hooklying piriformis stretch 1x30" each side Sidelying clam with red TB x20 reps bil SL hip abd x 8 1lb ankle wieght, bil Sit to stand from mat table feet on blue airex pad hold 5lb x 20 LE step up onto 6" box, contralateral hip flexion, 1x15 each LE red loop above knee: side stepping and monster walk fwd/bwd x 1 laps each covering 20' SLS 3-way "air taps" with Lt foot in front of mirror x 8 rounds on each LE    08/08/21: Nustep: L3 8 min- PTA present to discuss HEP adherence Supine short arc quads x20 with 2.5# ankle weight Supine 90/90 hamstring isometric in red physio ball 5x5 sec hold x2 sets: 2nd set with pelvic lift off mat table Supine straight leg bridge off red physio ball 2x5 reps  Supine hooklying piriformis stretch 1x30" each side SLR Rt off blue bolster x 10 Sidelying clam with red TB 2x10 reps bil SL  hip abd from blue bolster 2x5 bil Sit to stand from mat table (+ airex  pad) feet on blue airex pad hold 5lb x 20 Standing Rt LE step up onto 6" box, contralateral hip flexion, 2x10 reps  Blue loop above knee: side stepping down in front of mirror to stairs : 4 lengths x2 Rt SLS 3-way taps with Lt foot in front of mirror x 5 rounds Rt SLS x 20 sec    PATIENT EDUCATION:  Education details: Access Code: [email or web address] Person educated: Patient Education method: Explanation, Demonstration, Verbal cues, and Handouts Education comprehension: verbalized understanding and returned demonstration     HOME EXERCISE PROGRAM: Access Code: [email or web address] URL: [email or web address] Date: 07/31/2021 Prepared by: [facility]  Exercises - Clamshell with Resistance  - 1 x daily - 7 x weekly - 2 sets - 10 reps - Supine Hamstring Stretch with Strap  - 1 x daily - 7 x weekly - 1 sets - 3 reps - 30 hold - Supine ITB Stretch  - 1 x daily - 7 x weekly - 1 sets - 3 reps - 30 hold - Standing Gastroc Stretch at Counter  - 1 x daily - 7 x weekly - 1 sets - 3 reps - 30 hold - Supine Active Straight Leg Raise  - 1 x daily - 7 x weekly - 10 reps - Supine Hamstring Stretch with Strap  - 1 x daily - 7 x weekly - 2 sets - 3 reps - Supine Bridge  - 1 x daily - 7 x weekly - 10 reps  ASSESSMENT:   CLINICAL IMPRESSION: Pt arrives with no knee pain and reports no knee pain all week. Pt is prepared for discharge next week. Pt was able to quickly perform all her exercises pain free. Pt reports she is satisfied with her exercises and does not need more.     OBJECTIVE IMPAIRMENTS decreased ROM, decreased strength, hypomobility, increased edema, impaired flexibility, and pain.    ACTIVITY LIMITATIONS sitting   PARTICIPATION LIMITATIONS: meal prep and occupation   PERSONAL FACTORS 1 comorbidity: LBP and proximal hip weakness  are also affecting patient's functional outcome.     REHAB POTENTIAL: Good   CLINICAL DECISION MAKING: Stable/uncomplicated   EVALUATION COMPLEXITY: Low     GOALS: Goals reviewed with patient? Yes   SHORT TERM GOALS: Target date: 08/02/21 Pt will be ind with initial HEP without exacerbation of symptoms Baseline: Goal status: met   2.  Pt will report at least 25% reduction in pain with sitting and meal prep. Baseline:  Goal status: met  3.  Pt will be able to perform at least 10 SLR on Rt Baseline: unable to perform 1 rep Goal status: MET (07/31/21)   4.  Pt will achieve Rt knee A/ROM at least 127 deg Baseline: 122 Goal status: MET (07/31/21)     LONG TERM GOALS: Target date: 09/07/2021    Pt will be ind with advanced HEP and understand how to safely progress. Baseline:  Goal status: ongoing   2.  Improve FOTO to at least 88% to demo improved function.   Baseline: 80%, 99% on 08/14/21 Goal status: met 3.  Pt will achieve Rt knee A/ROM to at least 130 deg to improve sitting, gait and squatting. Baseline:  Goal status: MET   4.  Pt will report at least 75% reduction in pain with sitting at work and standing to cook meals.  Baseline:  Goal status: met   5.  Pt will achieve Rt LE flexility to WNL to reduce undue stress  across hip and knee joint. Baseline:  Goal status: ongoing   6.  Pt will achieve at least 4+/5 strength of Rt hip and knee to provide better support to knee joint. Baseline:  Goal status: met     PLAN: PT FREQUENCY: 2x/week   PT DURATION: 6 weeks   PLANNED INTERVENTIONS: Therapeutic exercises, Therapeutic activity, Neuromuscular re-education, Balance training, Gait training, Patient/Family education, Joint mobilization, Dry Needling, Electrical stimulation, Cryotherapy, Moist heat, Taping, Vasopneumatic device, and Manual therapy   PLAN FOR NEXT SESSION: DC next visit  Myrene Galas, PTA 08/17/21 10:47 AM

## 2021-08-21 ENCOUNTER — Encounter: Payer: Self-pay | Admitting: Physical Therapy

## 2021-08-21 ENCOUNTER — Ambulatory Visit: Payer: BC Managed Care – PPO | Admitting: Physical Therapy

## 2021-08-21 DIAGNOSIS — G8929 Other chronic pain: Secondary | ICD-10-CM

## 2021-08-21 DIAGNOSIS — M25561 Pain in right knee: Secondary | ICD-10-CM | POA: Diagnosis not present

## 2021-08-21 DIAGNOSIS — M6281 Muscle weakness (generalized): Secondary | ICD-10-CM

## 2021-08-21 NOTE — Therapy (Signed)
OUTPATIENT PHYSICAL THERAPY TREATMENT NOTE/DISCHARGE   Patient Name: Jamie [name] MRN: [id number] DOB:January 21, 1985, 36 y.o., female Today's Date: 08/21/2021    END OF SESSION:   PT End of Session - 08/21/21 1555     Visit Number 9    Date for PT Re-Evaluation 08/23/21    Authorization Type [employer]    PT Start Time 1093    PT Stop Time 1600    PT Time Calculation (min) 27 min    Activity Tolerance Patient tolerated treatment well;No increased pain    Behavior During Therapy WFL for tasks assessed/performed             Past Medical History:  Diagnosis Date   GERD (gastroesophageal reflux disease)    Medical history non-contributory    Past Surgical History:  Procedure Laterality Date   DILATION AND EVACUATION N/A 06/08/2019   Procedure: DILATATION AND EVACUATION;  Surgeon: Jerelyn Charles, MD;  Location: [place];  Service: Gynecology;  Laterality: N/A;   NO PAST SURGERIES     REFERRING PROVIDER: Inez Catalina, MD REFERRING DIAG: [id number] (ICD-10-CM) - Pain in right knee    THERAPY DIAG:  Chronic pain of right knee   Muscle weakness (generalized)   Rationale for Evaluation and Treatment Rehabilitation   ONSET DATE: one month ago, insidious onset   SUBJECTIVE: Pt continues to do well, no complaints all week.   SUBJECTIVE STATEMENT:     PERTINENT HISTORY: LBP, has two young children   PAIN:  PAIN:  Are you having pain? No not right now NPRS scale: 0/10 Pain location: Rt knee Pain orientation: Right, medial and lateral, deep PAIN TYPE: aching and deep Pain description:   Aggravating factors: prolonged sitting Relieving factors: just started Rx yesterday and maybe is helping, bandaging knee     PRECAUTIONS: None   WEIGHT BEARING RESTRICTIONS No   FALLS:  Has patient fallen in last 6 months? No   LIVING ENVIRONMENT: Lives with: lives with their family Lives in: House/apartment Stairs: No Has following equipment at home: None    OCCUPATION: desk job, full time   PLOF: Independent   PATIENT GOALS feel better       OBJECTIVE: (objective measures completed at initial evaluation unless otherwise dated)   DIAGNOSTIC FINDINGS: Trace early patellofemoral spurring    PATIENT SURVEYS:  08/14/21: FOTO 99%, goal met, removed from [place]: FOTO 80%, goal 88%   COGNITION:           Overall cognitive status: Within functional limits for tasks assessed                          SENSATION: WFL   EDEMA:  Mild infrapatellar edema present   MUSCLE LENGTH: 08/21/21 Hamstrings: no limitations noted Quads: no limitations     POSTURE: No Significant postural limitations   PALPATION: Non tender   LOWER EXTREMITY ROM:   Active/Passive ROM Right eval Left eval  Hip flexion      Hip extension      Hip abduction      Hip adduction      Hip internal rotation      Hip external rotation      Knee flexion AROM 135 deg (07/28/21) pain  134 AROM  Knee extension 0 0  Ankle dorsiflexion      Ankle plantarflexion      Ankle inversion      Ankle eversion       (Blank rows =  not tested)   LOWER EXTREMITY MMT:   MMT Right eval Left eval Right 8/8 Left 8/8  Hip flexion Unable to perform SLR, 3/_0 Hip extension          Hip abduction 3+ 3+ 5 5  Hip adduction          Hip internal rotation          Hip external rotation          Knee flexion _1 Knee extension 4+ _2 Ankle dorsiflexion          Ankle plantarflexion          Ankle inversion          Ankle eversion           (Blank rows = not tested)   LOWER EXTREMITY SPECIAL TESTS:  Knee special tests: Patellafemoral grind test: negative   FUNCTIONAL TESTS:  Sit to stand with ease   GAIT: Distance walked: clinic Assistive device utilized: None Level of assistance: Complete Independence Comments: no gait abnormality noted       TODAY'S TREATMENT:  08/21/21: Discussed importance of HEP adherence atleast 3 days a week moving forward to  maintain LE strength and flexibility Standing Sidestep with yellow tb around feet 3x5 reps each direction Hamstring curl Lt and Rt yellow TB x10 reps Squat with chair tap 2x10 reps     08/17/21: NuStep L5 x 5' PTA present to monitor and discuss progress Supine short arc quads x20 with 3# ankle weight SAQ to SLR off bolster x 10 3lb ankle weight, Rt Supine LE on red ball bridge 3 sec hold 10x Supine straight leg bridge off red physio ball x10 Supine hooklying piriformis stretch 1x30" each side Sidelying clam with red TB x20 reps bil SL hip abd 2x10 1lb ankle wieght, bil Sit to stand from mat table feet on blue airex pad hold 5lb x 20 LE step up onto 6" box, contralateral hip flexion, 1x15 each LE red loop above knee: side stepping and monster walk fwd/bwd 4 lengths each SLS 3-way "air taps" with Lt foot in front of mirror x 8 rounds on each LE    PATIENT EDUCATION:  Education details: Access Code: [email or web address] Person educated: Patient Education method: Explanation, Demonstration, Verbal cues, and Handouts Education comprehension: verbalized understanding and returned demonstration     HOME EXERCISE PROGRAM: Access Code: [email or web address] URL: [email or web address] Date: 08/21/2021 Prepared by: [facility]  Exercises - Supine Hamstring Stretch with Strap  - 1 x daily - 3 x weekly - 1 sets - 3 reps - 30 hold - Supine ITB Stretch  - 1 x daily - 3 x weekly - 1 sets - 3 reps - 30 hold - Standing Gastroc Stretch at Counter  - 1 x daily - 3 x weekly - 1 sets - 3 reps - 30 hold - Side Stepping with Resistance at Feet  - 1 x daily - 3 x weekly - 3 sets - 5 reps - Squat with Chair Touch  - 1 x daily - 3 x weekly - 2 sets - 10 reps - Active Straight Leg Raise with Quad Set  - 1 x daily - 3 x weekly - 1 sets - 20 reps - Single Leg Heel Raise  - 1 x daily - 3 x weekly - 1 sets - 20 reps - Standing Hamstring Curl with Resistance  -  1 x daily -  3 x weekly - 2 sets - 10 reps   ASSESSMENT:   CLINICAL IMPRESSION: Pt was discharged this visit having met all of her short and long term goals. Her LE flexibility and ROM is within normal limits, her LE strength is 5/5 and pain free. Pt has increased HEP adherence to 3 days a week and has no more pain throughout the day. She is pleased with her progress and demonstrated good understanding of her HEP updates made during today's session.      OBJECTIVE IMPAIRMENTS decreased ROM, decreased strength, hypomobility, increased edema, impaired flexibility, and pain.    ACTIVITY LIMITATIONS sitting   PARTICIPATION LIMITATIONS: meal prep and occupation   PERSONAL FACTORS 1 comorbidity: LBP and proximal hip weakness  are also affecting patient's functional outcome.    REHAB POTENTIAL: Good   CLINICAL DECISION MAKING: Stable/uncomplicated   EVALUATION COMPLEXITY: Low     GOALS: Goals reviewed with patient? Yes   SHORT TERM GOALS: Target date: 08/02/21 Pt will be ind with initial HEP without exacerbation of symptoms Baseline: Goal status: met   2.  Pt will report at least 25% reduction in pain with sitting and meal prep. Baseline:  Goal status: met   3.  Pt will be able to perform at least 10 SLR on Rt Baseline: unable to perform 1 rep Goal status: MET (07/31/21)   4.  Pt will achieve Rt knee A/ROM at least 127 deg Baseline: 122 Goal status: MET (07/31/21)     LONG TERM GOALS: Target date: 09/07/2021    Pt will be ind with advanced HEP and understand how to safely progress. Baseline:  Goal status: ongoing   2.  Improve FOTO to at least 88% to demo improved function.   Baseline: 80%, 99% on 08/14/21 Goal status: met 3.  Pt will achieve Rt knee A/ROM to at least 130 deg to improve sitting, gait and squatting. Baseline:  Goal status: MET   4.  Pt will report at least 75% reduction in pain with sitting at work and standing to cook meals.  Baseline:  Goal status: met   5.  Pt  will achieve Rt LE flexility to WNL to reduce undue stress across hip and knee joint. Baseline:  Goal status: MET   6.  Pt will achieve at least 4+/5 strength of Rt hip and knee to provide better support to knee joint. Baseline:  Goal status: met     PLAN: PT FREQUENCY: 2x/week   PT DURATION: 6 weeks   PLANNED INTERVENTIONS: Therapeutic exercises, Therapeutic activity, Neuromuscular re-education, Balance training, Gait training, Patient/Family education, Joint mobilization, Dry Needling, Electrical stimulation, Cryotherapy, Moist heat, Taping, Vasopneumatic device, and Manual therapy   PLAN FOR NEXT SESSION: DC from PT   PHYSICAL THERAPY DISCHARGE SUMMARY  Visits from Start of Care: 9  Current functional level related to goals / functional outcomes: See above for more details    Remaining deficits: See above for more details    Education / Equipment: See above for more details   Patient agrees to discharge. Patient goals were met. Patient is being discharged due to meeting the stated rehab goals.  4:07 PM,08/21/21 [place], [facility] [place]

## 2021-08-24 ENCOUNTER — Ambulatory Visit: Payer: BC Managed Care – PPO | Admitting: Physical Therapy

## 2023-03-05 IMAGING — CR DG KNEE 3 VIEWS*R*
3 series · 3 of 3 positions shown · non-contrast
Comparison: None Available.

CLINICAL DATA: Medial right knee pain for 6 months. No known
injury.

EXAM:
RIGHT KNEE - 3 VIEW

[w knee ap right (1 of 2)]
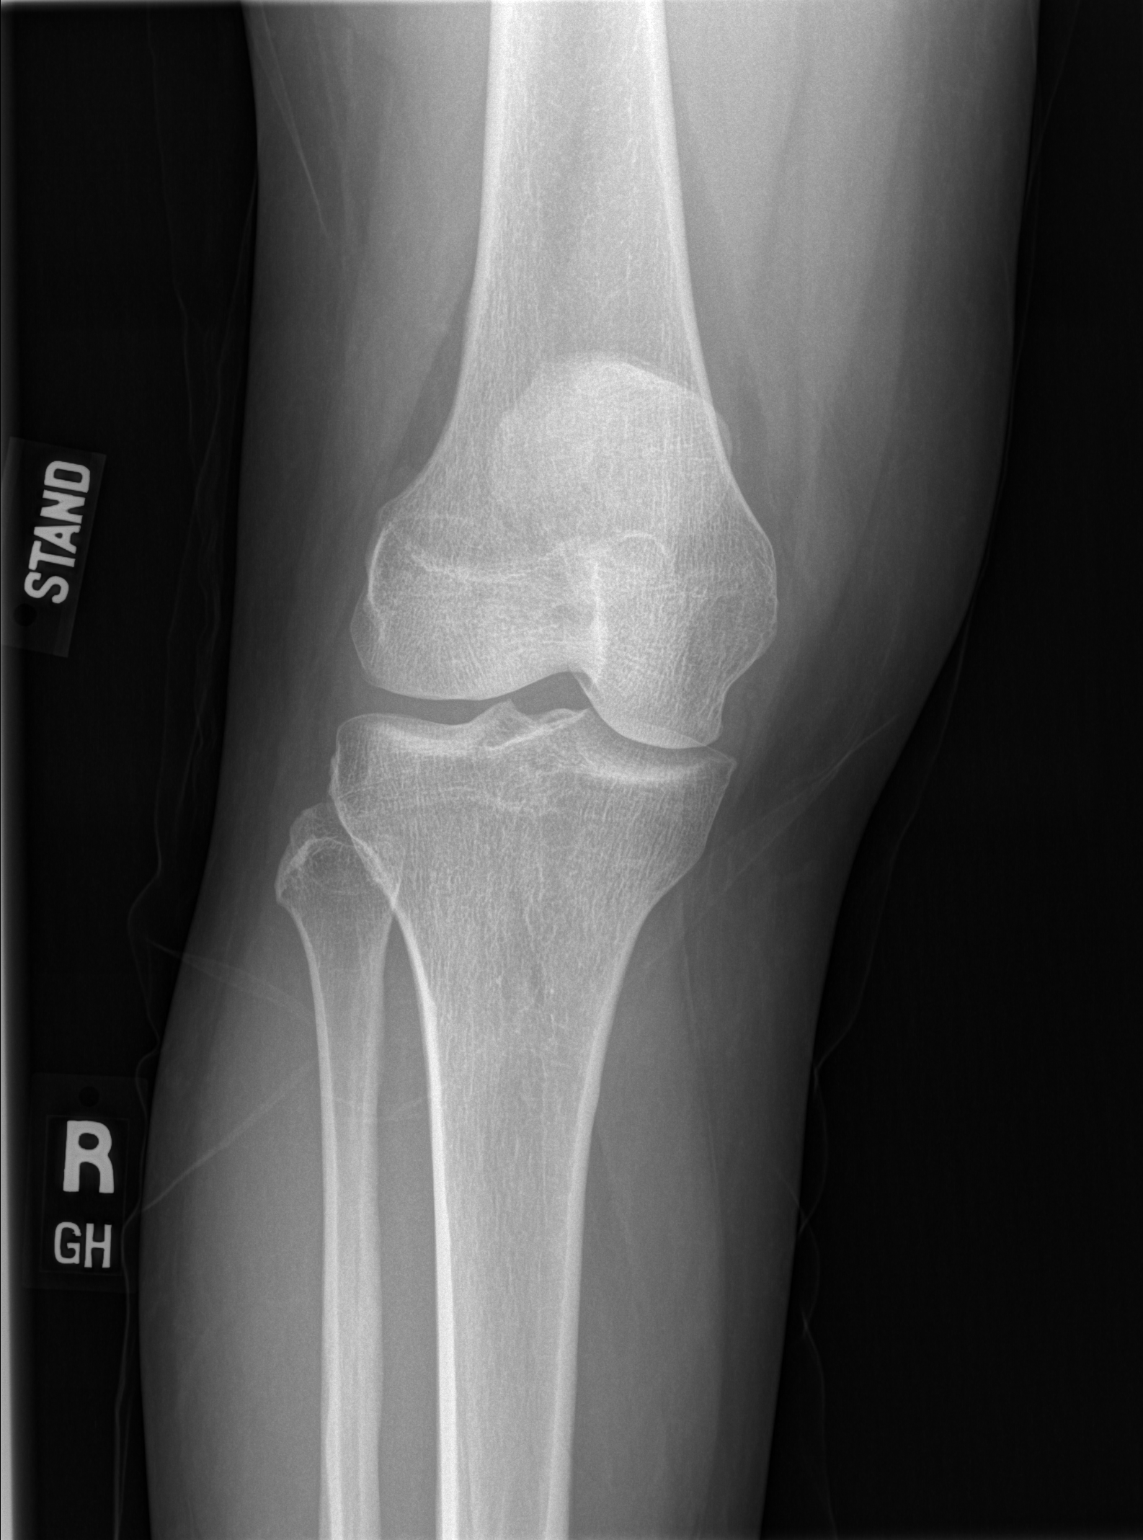

[w knee lat. right]
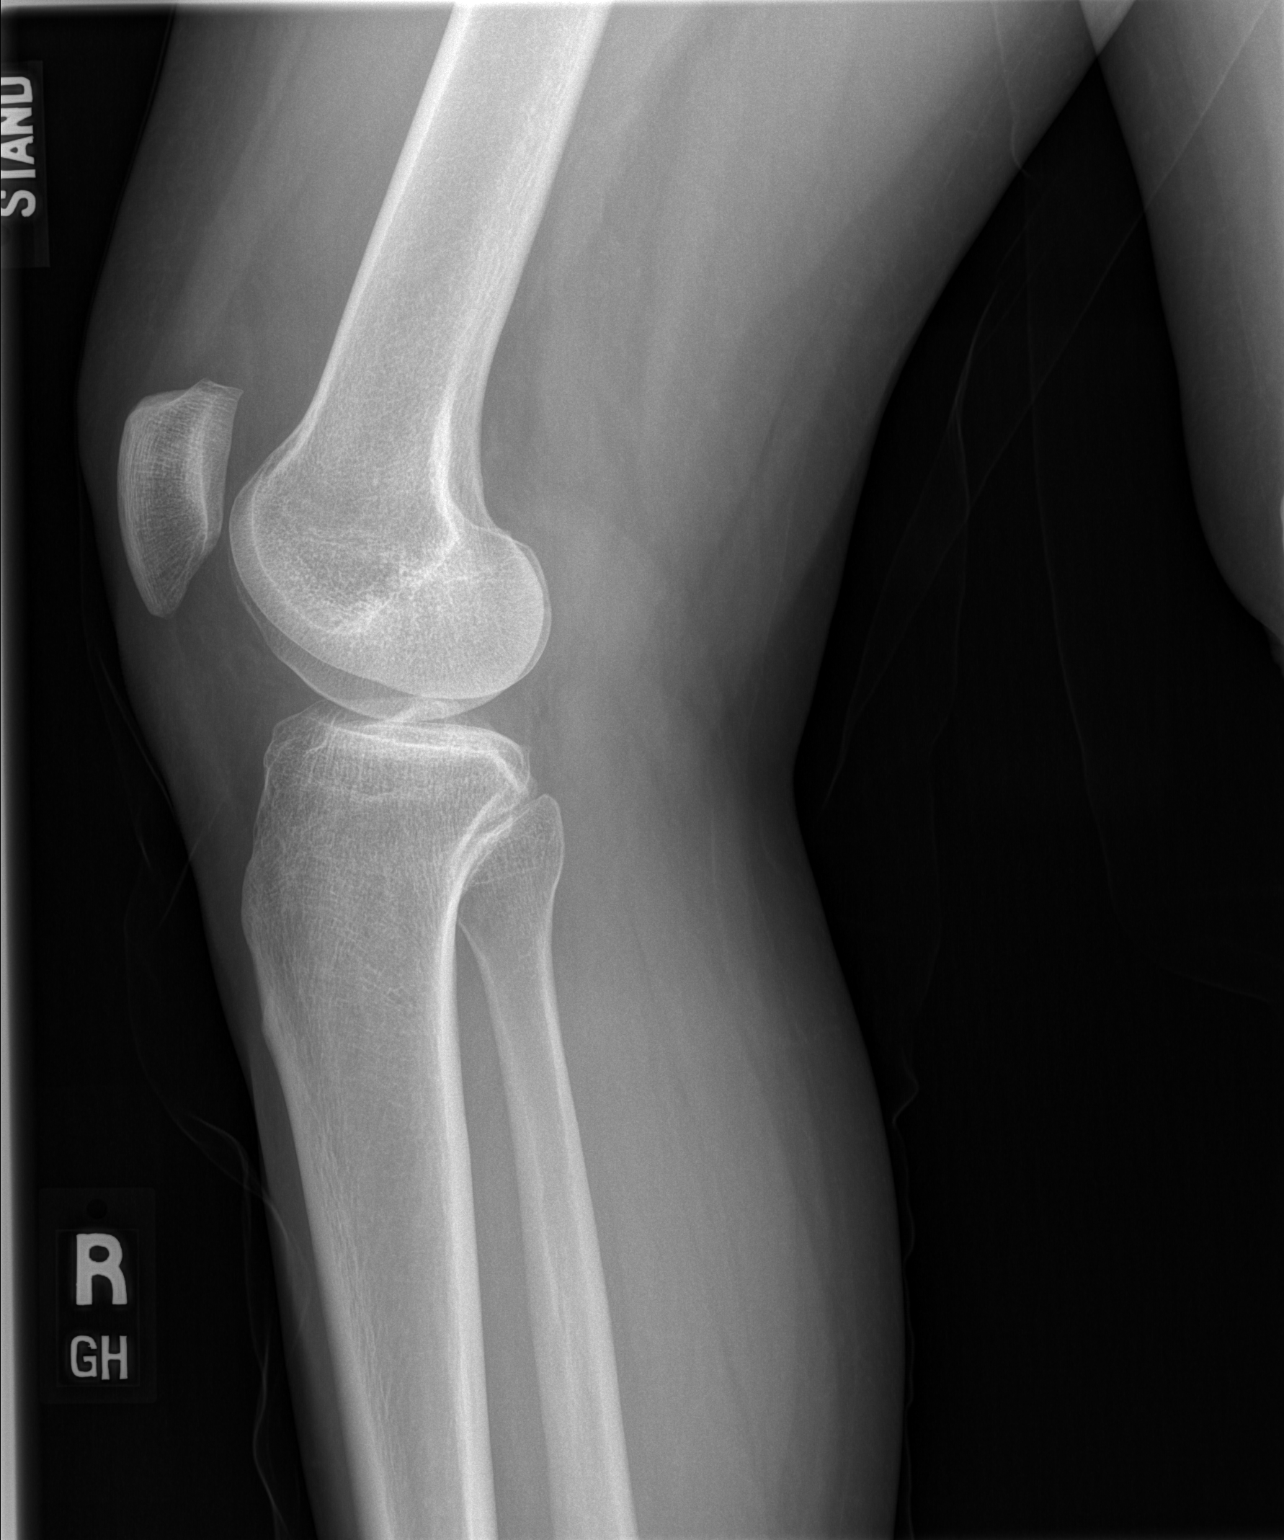

[w knee ap right (2 of 2)]
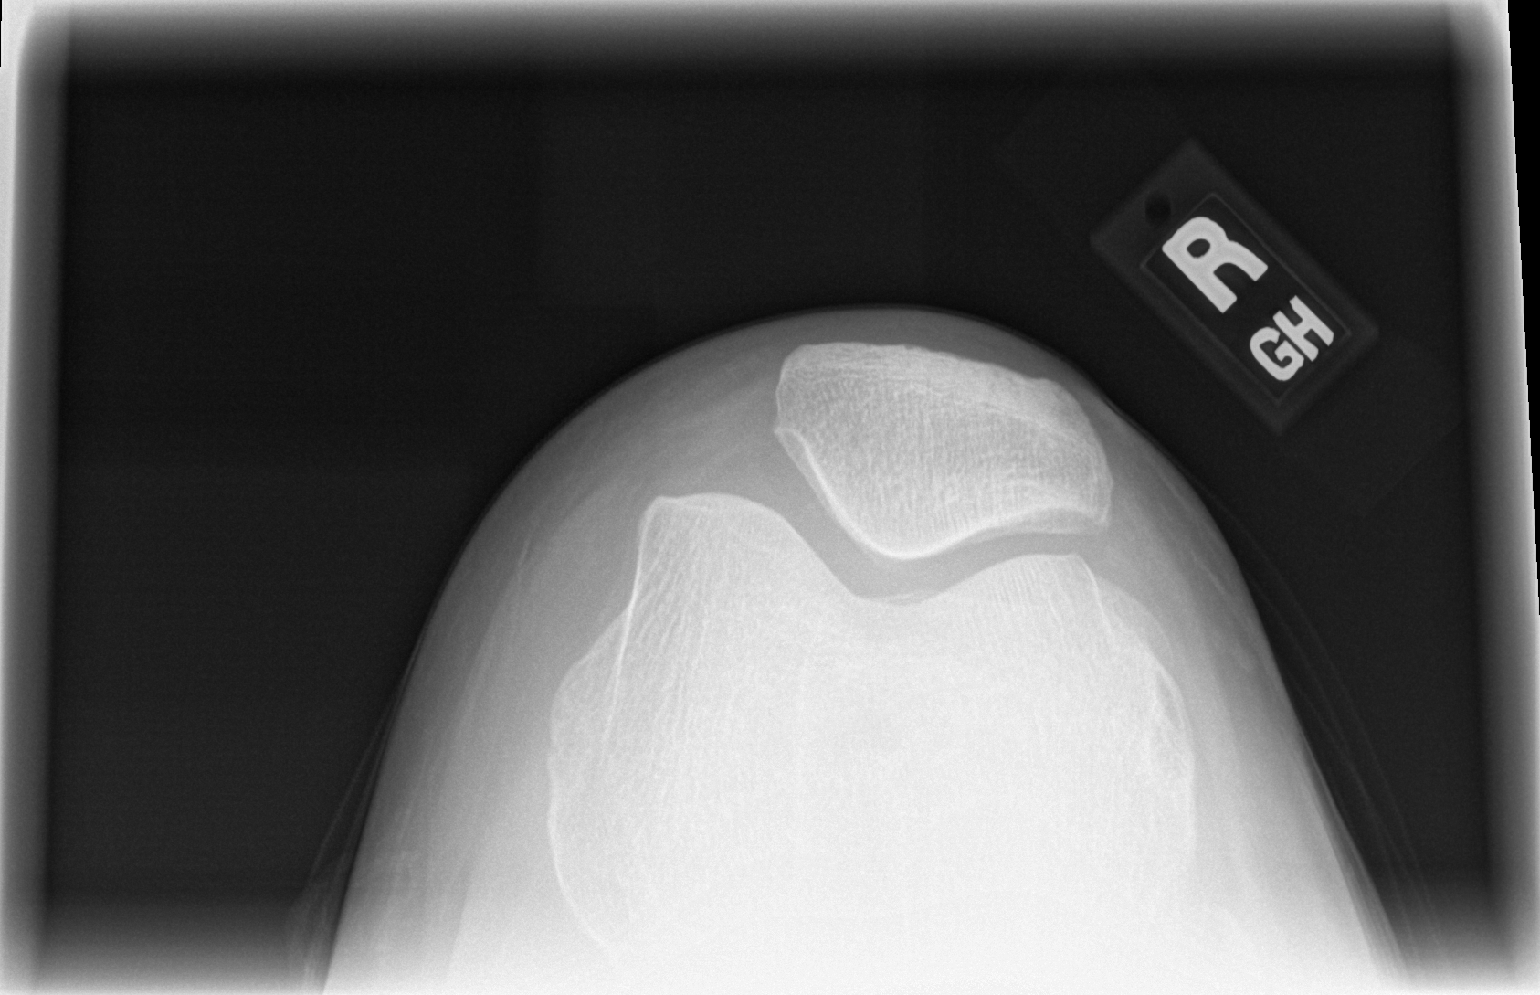

[3 of 3 positions shown; findings below may reference images not displayed]

FINDINGS: AP and lateral views obtained standing. Normal alignment. Normal
joint spaces. Trace early peripheral patellofemoral spurring. No
fracture. No erosion, bony destruction or focal lesion. No knee
joint effusion. Unremarkable soft tissues.
IMPRESSION: Trace early patellofemoral spurring.
